# Patient Record
Sex: Female | Born: 1985 | Race: White | Hispanic: No | Marital: Single | Smoking: Never smoker
Health system: Southern US, Community
[De-identification: ages and names within clinical notes are randomized; demographics above are authoritative.]

## PROBLEM LIST (undated history)

## (undated) DIAGNOSIS — D649 Anemia, unspecified: Secondary | ICD-10-CM

## (undated) DIAGNOSIS — R51 Headache: Secondary | ICD-10-CM

## (undated) DIAGNOSIS — R519 Headache, unspecified: Secondary | ICD-10-CM

## (undated) HISTORY — DX: Anemia, unspecified: D64.9

## (undated) HISTORY — DX: Headache, unspecified: R51.9

## (undated) HISTORY — DX: Headache: R51

---

## 2004-08-31 ENCOUNTER — Ambulatory Visit: Payer: Self-pay | Admitting: Unknown Physician Specialty

## 2005-08-23 ENCOUNTER — Emergency Department: Payer: Self-pay | Admitting: Emergency Medicine

## 2008-05-19 ENCOUNTER — Inpatient Hospital Stay: Payer: Self-pay | Admitting: Obstetrics and Gynecology

## 2010-08-27 ENCOUNTER — Encounter: Payer: Self-pay | Admitting: Obstetrics and Gynecology

## 2011-02-26 ENCOUNTER — Ambulatory Visit: Payer: Self-pay | Admitting: Obstetrics and Gynecology

## 2011-03-01 ENCOUNTER — Inpatient Hospital Stay: Payer: Self-pay | Admitting: Obstetrics and Gynecology

## 2014-10-17 ENCOUNTER — Encounter: Payer: Self-pay | Admitting: Podiatry

## 2014-10-17 ENCOUNTER — Ambulatory Visit (INDEPENDENT_AMBULATORY_CARE_PROVIDER_SITE_OTHER): Payer: BC Managed Care – PPO

## 2014-10-17 ENCOUNTER — Ambulatory Visit (INDEPENDENT_AMBULATORY_CARE_PROVIDER_SITE_OTHER): Payer: BC Managed Care – PPO | Admitting: Podiatry

## 2014-10-17 VITALS — BP 123/76 | HR 58 | Resp 16 | Ht 62.0 in | Wt 145.0 lb

## 2014-10-17 DIAGNOSIS — M779 Enthesopathy, unspecified: Secondary | ICD-10-CM | POA: Diagnosis not present

## 2014-10-17 DIAGNOSIS — R52 Pain, unspecified: Secondary | ICD-10-CM

## 2014-10-17 DIAGNOSIS — M722 Plantar fascial fibromatosis: Secondary | ICD-10-CM

## 2014-10-17 MED ORDER — MELOXICAM 15 MG PO TABS: 15.0000 mg | ORAL_TABLET | Freq: Every day | ORAL | Status: DC

## 2014-10-17 MED ORDER — TRIAMCINOLONE ACETONIDE 10 MG/ML IJ SUSP
10.0000 mg | Freq: Once | INTRAMUSCULAR | Status: AC
  Administered 2014-10-17: 10 mg

## 2014-10-17 NOTE — Progress Notes (Signed)
Subjective:     Patient ID: Ellwood Sayersmanda D Matar, female   DOB: 12/29/1985, 29 y.o.   MRN: [id number]  HPI 29 year old female presents the office concerns her right foot pain which is been ongoing for greater than one month. She states that she has pain in the morning or after periods of rest which is relieved by ambulation. She states that as is his been ongoing she starts to have more throbbing sensation at the end of the day as well. She states the pain started the bottom of her heel and moved the outside aspect. She denies any history of injury or trauma to the area. She states that she did switch to wearing sandals at the time of the symptoms started. She denies any swelling or redness. Denies any numbness or tingling. The pain does not wake her up at night. No other complaints at this time.  Review of Systems  All other systems reviewed and are negative.       Objective:   Physical Exam AAO x3, NAD DP/PT pulses palpable bilaterally, CRT less than 3 seconds Protective sensation intact with Simms Weinstein monofilament, vibratory sensation intact, Achilles tendon reflex intact Tenderness to palpation overlying the plantar medial tubercle of the calcaneus to right heel at the insertion of the plantar fascia. There is no pain along the course of plantar fascial within the arch of the foot. There is no pain with lateral compression of the calcaneus or pain the vibratory sensation. No pain on the posterior aspect of the calcaneus or along the course/insertion of the Achilles tendon. There is mild discomfort along the course of the peroneal tendons just proximal to the fifth metatarsal base. Tendons appear to be intact. There is no overlying edema, erythema, increase in warmth. No other areas of tenderness palpation or pain with vibratory sensation to the foot/ankle. MMT 5/5, ROM WNL No open lesions or pre-ulcerative lesions are identified. No pain with calf compression, swelling, warmth, erythema.       Assessment:     29 year old female with right heel pain, likely plantar fascial compensation resulting in peroneal tendinitis    Plan:     -X-rays were obtained and reviewed with the patient.  -Treatment options discussed including all alternatives, risks, and complications -Patient elects to proceed with steroid injection into the right heel. Under sterile skin preparation, a total of 2.5cc of kenalog 10, 0.5% Marcaine plain, and 2% lidocaine plain were infiltrated into the symptomatic area without complication. A band-aid was applied. Patient tolerated the injection well without complication. Post-injection care with discussed with the patient. Discussed with the patient to ice the area over the next couple of days to help prevent a steroid flare -Dispensed plantar fascial brace  -Prescribed mobic. Discussed side effects of the medication and directed to stop if any are to occur and call the office.  -Discussed stretching and icing activities to be performed on a consistent basis daily.  -Discussed shoe gear modifications recommended not to go barefoot even while at home.  -Discussed orthotics.  -Follow-up 3 weeks or sooner if any problems arise. In the meantime, encouraged to call the office with any questions, concerns, change in symptoms.   Ovid CurdMatthew Wagoner, DPM

## 2014-10-17 NOTE — Patient Instructions (Signed)

## 2014-11-12 ENCOUNTER — Ambulatory Visit (INDEPENDENT_AMBULATORY_CARE_PROVIDER_SITE_OTHER): Payer: BC Managed Care – PPO | Admitting: Podiatry

## 2014-11-12 ENCOUNTER — Encounter: Payer: Self-pay | Admitting: Podiatry

## 2014-11-12 VITALS — BP 109/61 | HR 62 | Resp 16

## 2014-11-12 DIAGNOSIS — M722 Plantar fascial fibromatosis: Secondary | ICD-10-CM | POA: Diagnosis not present

## 2014-11-12 MED ORDER — METHYLPREDNISOLONE 4 MG PO TBPK
ORAL_TABLET | ORAL | Status: DC
Start: 2014-11-12 — End: 2015-07-31

## 2014-11-12 NOTE — Progress Notes (Signed)
Patient ID: Brittany Mosley, female   DOB: 20-Jan-1986, 29 y.o.   MRN: [id number]  Subjective: 29 year old female presents the office they for followup evaluation of right heel pain for plantar fasciitis as well as peroneal tendinitis. She states that she feels that her pain has improved compared to last appointment was to discontinue the pain mostly to the bottom of her heel. She has continued to wear the plantar fascial brace however she cannot wear was shoes uncomfortable. She was to brace with a sandal which seems to help with him in regular tennis shoe. She has a states that she can feel different she does not take the mobic. She does not think the steroid injection helped much.  She is continuing stretching icing it is not consistent basis. Denies any swelling or redness. Denies any numbness or tingling. No other complaints at this time. No acute changes since last appointment.  Objective: AAO x3, NAD DP/PT pulses palpable bilaterally, CRT less than 3 seconds Protective sensation intact with Simms Weinstein monofilament, vibratory sensation intact, Achilles tendon reflex intact There is continued tenderness to palpation overlying the plantar medial tubercle of the calcaneus to the right heel at the insertion of the plantar fascia. There is no pain along the course of plantar fascia within the arch of the foot. There is no pain with lateral compression of the calcaneus or pain the vibratory sensation. No pain on the posterior aspect of the calcaneus or along the course/insertion of the Achilles tendon. There is no pain along the course of the peroneal tendons.There is no overlying edema, erythema, increase in warmth. No other areas of tenderness palpation or pain with vibratory sensation to the foot/ankle. MMT 5/5, ROM WNL No open lesions or pre-ulcerative lesions are identified. No pain with calf compression, swelling, warmth, erythema.  Assessment: 29 -year-old female followup evaluation of right  plantar fasciitis  Plan: -Treatment options discussed including all alternatives, risks, and complications -Hold off another steroid injection a significant relief last appointment. -Prescribed Medrol Dosepak. Off on taking mobic while on steroids. -Continue stretching and ice activities a consistent basis. -Follow-up 4 weeks or sooner if any problems arise. In the meantime, encouraged to call the office with any questions, concerns, change in symptoms.  -Discussed shoe gear modifications. She'll likely benefit from orthotics is a 29 mechanical in nature.   Ovid Curd, DPM

## 2014-11-12 NOTE — Patient Instructions (Signed)
Plantar Fasciitis (Heel Spur Syndrome) with Rehab The plantar fascia is a fibrous, ligament-like, soft-tissue structure that spans the bottom of the foot. Plantar fasciitis is a condition that causes pain in the foot due to inflammation of the tissue. SYMPTOMS   Pain and tenderness on the underneath side of the foot.  Pain that worsens with standing or walking. CAUSES  Plantar fasciitis is caused by irritation and injury to the plantar fascia on the underneath side of the foot. Common mechanisms of injury include:  Direct trauma to bottom of the foot.  Damage to a small nerve that runs under the foot where the main fascia attaches to the heel bone.  Stress placed on the plantar fascia due to bone spurs. RISK INCREASES WITH:   Activities that place stress on the plantar fascia (running, jumping, pivoting, or cutting).  Poor strength and flexibility.  Improperly fitted shoes.  Tight calf muscles.  Flat feet.  Failure to warm-up properly before activity.  Obesity. PREVENTION  Warm up and stretch properly before activity.  Allow for adequate recovery between workouts.  Maintain physical fitness:  Strength, flexibility, and endurance.  Cardiovascular fitness.  Maintain a health body weight.  Avoid stress on the plantar fascia.  Wear properly fitted shoes, including arch supports for individuals who have flat feet. PROGNOSIS  If treated properly, then the symptoms of plantar fasciitis usually resolve without surgery. However, occasionally surgery is necessary. RELATED COMPLICATIONS   Recurrent symptoms that may result in a chronic condition.  Problems of the lower back that are caused by compensating for the injury, such as limping.  Pain or weakness of the foot during push-off following surgery.  Chronic inflammation, scarring, and partial or complete fascia tear, occurring more often from repeated injections. TREATMENT  Treatment initially involves the use of  ice and medication to help reduce pain and inflammation. The use of strengthening and stretching exercises may help reduce pain with activity, especially stretches of the Achilles tendon. These exercises may be performed at home or with a therapist. Your caregiver may recommend that you use heel cups of arch supports to help reduce stress on the plantar fascia. Occasionally, corticosteroid injections are given to reduce inflammation. If symptoms persist for greater than 6 months despite non-surgical (conservative), then surgery may be recommended.  MEDICATION   If pain medication is necessary, then nonsteroidal anti-inflammatory medications, such as aspirin and ibuprofen, or other minor pain relievers, such as acetaminophen, are often recommended.  Do not take pain medication within 7 days before surgery.  Prescription pain relievers may be given if deemed necessary by your caregiver. Use only as directed and only as much as you need.  Corticosteroid injections may be given by your caregiver. These injections should be reserved for the most serious cases, because they may only be given a certain number of times. HEAT AND COLD  Cold treatment (icing) relieves pain and reduces inflammation. Cold treatment should be applied for 10 to 15 minutes every 2 to 3 hours for inflammation and pain and immediately after any activity that aggravates your symptoms. Use ice packs or massage the area with a piece of ice (ice massage).  Heat treatment may be used prior to performing the stretching and strengthening activities prescribed by your caregiver, physical therapist, or athletic trainer. Use a heat pack or soak the injury in warm water. SEEK IMMEDIATE MEDICAL CARE IF:  Treatment seems to offer no benefit, or the condition worsens.  Any medications produce adverse side effects. EXERCISES RANGE   OF MOTION (ROM) AND STRETCHING EXERCISES - Plantar Fasciitis (Heel Spur Syndrome) These exercises may help you  when beginning to rehabilitate your injury. Your symptoms may resolve with or without further involvement from your physician, physical therapist or athletic trainer. While completing these exercises, remember:   Restoring tissue flexibility helps normal motion to return to the joints. This allows healthier, less painful movement and activity.  An effective stretch should be held for at least 30 seconds.  A stretch should never be painful. You should only feel a gentle lengthening or release in the stretched tissue. RANGE OF MOTION - Toe Extension, Flexion  Sit with your right / left leg crossed over your opposite knee.  Grasp your toes and gently pull them back toward the top of your foot. You should feel a stretch on the bottom of your toes and/or foot.  Hold this stretch for __________ seconds.  Now, gently pull your toes toward the bottom of your foot. You should feel a stretch on the top of your toes and or foot.  Hold this stretch for __________ seconds. Repeat __________ times. Complete this stretch __________ times per day.  RANGE OF MOTION - Ankle Dorsiflexion, Active Assisted  Remove shoes and sit on a chair that is preferably not on a carpeted surface.  Place right / left foot under knee. Extend your opposite leg for support.  Keeping your heel down, slide your right / left foot back toward the chair until you feel a stretch at your ankle or calf. If you do not feel a stretch, slide your bottom forward to the edge of the chair, while still keeping your heel down.  Hold this stretch for __________ seconds. Repeat __________ times. Complete this stretch __________ times per day.  STRETCH - Gastroc, Standing  Place hands on wall.  Extend right / left leg, keeping the front knee somewhat bent.  Slightly point your toes inward on your back foot.  Keeping your right / left heel on the floor and your knee straight, shift your weight toward the wall, not allowing your back to  arch.  You should feel a gentle stretch in the right / left calf. Hold this position for __________ seconds. Repeat __________ times. Complete this stretch __________ times per day. STRETCH - Soleus, Standing  Place hands on wall.  Extend right / left leg, keeping the other knee somewhat bent.  Slightly point your toes inward on your back foot.  Keep your right / left heel on the floor, bend your back knee, and slightly shift your weight over the back leg so that you feel a gentle stretch deep in your back calf.  Hold this position for __________ seconds. Repeat __________ times. Complete this stretch __________ times per day. STRETCH - Gastrocsoleus, Standing  Note: This exercise can place a lot of stress on your foot and ankle. Please complete this exercise only if specifically instructed by your caregiver.   Place the ball of your right / left foot on a step, keeping your other foot firmly on the same step.  Hold on to the wall or a rail for balance.  Slowly lift your other foot, allowing your body weight to press your heel down over the edge of the step.  You should feel a stretch in your right / left calf.  Hold this position for __________ seconds.  Repeat this exercise with a slight bend in your right / left knee. Repeat __________ times. Complete this stretch __________ times per day.    STRENGTHENING EXERCISES - Plantar Fasciitis (Heel Spur Syndrome)  These exercises may help you when beginning to rehabilitate your injury. They may resolve your symptoms with or without further involvement from your physician, physical therapist or athletic trainer. While completing these exercises, remember:   Muscles can gain both the endurance and the strength needed for everyday activities through controlled exercises.  Complete these exercises as instructed by your physician, physical therapist or athletic trainer. Progress the resistance and repetitions only as guided. STRENGTH -  Towel Curls  Sit in a chair positioned on a non-carpeted surface.  Place your foot on a towel, keeping your heel on the floor.  Pull the towel toward your heel by only curling your toes. Keep your heel on the floor.  If instructed by your physician, physical therapist or athletic trainer, add ____________________ at the end of the towel. Repeat __________ times. Complete this exercise __________ times per day. STRENGTH - Ankle Inversion  Secure one end of a rubber exercise band/tubing to a fixed object (table, pole). Loop the other end around your foot just before your toes.  Place your fists between your knees. This will focus your strengthening at your ankle.  Slowly, pull your big toe up and in, making sure the band/tubing is positioned to resist the entire motion.  Hold this position for __________ seconds.  Have your muscles resist the band/tubing as it slowly pulls your foot back to the starting position. Repeat __________ times. Complete this exercises __________ times per day.  Document Released: 04/05/2005 Document Revised: 06/28/2011 Document Reviewed: 07/18/2008 [employer] Patient Information 2015 [employer]. This information is not intended to replace advice given to you by your health care provider. Make sure you discuss any questions you have with your health care provider.  

## 2014-12-10 ENCOUNTER — Encounter: Payer: Self-pay | Admitting: Podiatry

## 2014-12-10 ENCOUNTER — Ambulatory Visit (INDEPENDENT_AMBULATORY_CARE_PROVIDER_SITE_OTHER): Payer: BC Managed Care – PPO | Admitting: Podiatry

## 2014-12-10 VITALS — BP 125/71 | HR 65 | Resp 16

## 2014-12-10 DIAGNOSIS — M722 Plantar fascial fibromatosis: Secondary | ICD-10-CM

## 2014-12-10 MED ORDER — TRIAMCINOLONE ACETONIDE 10 MG/ML IJ SUSP
10.0000 mg | Freq: Once | INTRAMUSCULAR | Status: AC
  Administered 2014-12-10: 10 mg

## 2014-12-10 NOTE — Patient Instructions (Signed)
Plantar Fasciitis (Heel Spur Syndrome) with Rehab The plantar fascia is a fibrous, ligament-like, soft-tissue structure that spans the bottom of the foot. Plantar fasciitis is a condition that causes pain in the foot due to inflammation of the tissue. SYMPTOMS   Pain and tenderness on the underneath side of the foot.  Pain that worsens with standing or walking. CAUSES  Plantar fasciitis is caused by irritation and injury to the plantar fascia on the underneath side of the foot. Common mechanisms of injury include:  Direct trauma to bottom of the foot.  Damage to a small nerve that runs under the foot where the main fascia attaches to the heel bone.  Stress placed on the plantar fascia due to bone spurs. RISK INCREASES WITH:   Activities that place stress on the plantar fascia (running, jumping, pivoting, or cutting).  Poor strength and flexibility.  Improperly fitted shoes.  Tight calf muscles.  Flat feet.  Failure to warm-up properly before activity.  Obesity. PREVENTION  Warm up and stretch properly before activity.  Allow for adequate recovery between workouts.  Maintain physical fitness:  Strength, flexibility, and endurance.  Cardiovascular fitness.  Maintain a health body weight.  Avoid stress on the plantar fascia.  Wear properly fitted shoes, including arch supports for individuals who have flat feet. PROGNOSIS  If treated properly, then the symptoms of plantar fasciitis usually resolve without surgery. However, occasionally surgery is necessary. RELATED COMPLICATIONS   Recurrent symptoms that may result in a chronic condition.  Problems of the lower back that are caused by compensating for the injury, such as limping.  Pain or weakness of the foot during push-off following surgery.  Chronic inflammation, scarring, and partial or complete fascia tear, occurring more often from repeated injections. TREATMENT  Treatment initially involves the use of  ice and medication to help reduce pain and inflammation. The use of strengthening and stretching exercises may help reduce pain with activity, especially stretches of the Achilles tendon. These exercises may be performed at home or with a therapist. Your caregiver may recommend that you use heel cups of arch supports to help reduce stress on the plantar fascia. Occasionally, corticosteroid injections are given to reduce inflammation. If symptoms persist for greater than 6 months despite non-surgical (conservative), then surgery may be recommended.  MEDICATION   If pain medication is necessary, then nonsteroidal anti-inflammatory medications, such as aspirin and ibuprofen, or other minor pain relievers, such as acetaminophen, are often recommended.  Do not take pain medication within 7 days before surgery.  Prescription pain relievers may be given if deemed necessary by your caregiver. Use only as directed and only as much as you need.  Corticosteroid injections may be given by your caregiver. These injections should be reserved for the most serious cases, because they may only be given a certain number of times. HEAT AND COLD  Cold treatment (icing) relieves pain and reduces inflammation. Cold treatment should be applied for 10 to 15 minutes every 2 to 3 hours for inflammation and pain and immediately after any activity that aggravates your symptoms. Use ice packs or massage the area with a piece of ice (ice massage).  Heat treatment may be used prior to performing the stretching and strengthening activities prescribed by your caregiver, physical therapist, or athletic trainer. Use a heat pack or soak the injury in warm water. SEEK IMMEDIATE MEDICAL CARE IF:  Treatment seems to offer no benefit, or the condition worsens.  Any medications produce adverse side effects. EXERCISES RANGE   OF MOTION (ROM) AND STRETCHING EXERCISES - Plantar Fasciitis (Heel Spur Syndrome) These exercises may help you  when beginning to rehabilitate your injury. Your symptoms may resolve with or without further involvement from your physician, physical therapist or athletic trainer. While completing these exercises, remember:   Restoring tissue flexibility helps normal motion to return to the joints. This allows healthier, less painful movement and activity.  An effective stretch should be held for at least 30 seconds.  A stretch should never be painful. You should only feel a gentle lengthening or release in the stretched tissue. RANGE OF MOTION - Toe Extension, Flexion  Sit with your right / left leg crossed over your opposite knee.  Grasp your toes and gently pull them back toward the top of your foot. You should feel a stretch on the bottom of your toes and/or foot.  Hold this stretch for __________ seconds.  Now, gently pull your toes toward the bottom of your foot. You should feel a stretch on the top of your toes and or foot.  Hold this stretch for __________ seconds. Repeat __________ times. Complete this stretch __________ times per day.  RANGE OF MOTION - Ankle Dorsiflexion, Active Assisted  Remove shoes and sit on a chair that is preferably not on a carpeted surface.  Place right / left foot under knee. Extend your opposite leg for support.  Keeping your heel down, slide your right / left foot back toward the chair until you feel a stretch at your ankle or calf. If you do not feel a stretch, slide your bottom forward to the edge of the chair, while still keeping your heel down.  Hold this stretch for __________ seconds. Repeat __________ times. Complete this stretch __________ times per day.  STRETCH - Gastroc, Standing  Place hands on wall.  Extend right / left leg, keeping the front knee somewhat bent.  Slightly point your toes inward on your back foot.  Keeping your right / left heel on the floor and your knee straight, shift your weight toward the wall, not allowing your back to  arch.  You should feel a gentle stretch in the right / left calf. Hold this position for __________ seconds. Repeat __________ times. Complete this stretch __________ times per day. STRETCH - Soleus, Standing  Place hands on wall.  Extend right / left leg, keeping the other knee somewhat bent.  Slightly point your toes inward on your back foot.  Keep your right / left heel on the floor, bend your back knee, and slightly shift your weight over the back leg so that you feel a gentle stretch deep in your back calf.  Hold this position for __________ seconds. Repeat __________ times. Complete this stretch __________ times per day. STRETCH - Gastrocsoleus, Standing  Note: This exercise can place a lot of stress on your foot and ankle. Please complete this exercise only if specifically instructed by your caregiver.   Place the ball of your right / left foot on a step, keeping your other foot firmly on the same step.  Hold on to the wall or a rail for balance.  Slowly lift your other foot, allowing your body weight to press your heel down over the edge of the step.  You should feel a stretch in your right / left calf.  Hold this position for __________ seconds.  Repeat this exercise with a slight bend in your right / left knee. Repeat __________ times. Complete this stretch __________ times per day.    STRENGTHENING EXERCISES - Plantar Fasciitis (Heel Spur Syndrome)  These exercises may help you when beginning to rehabilitate your injury. They may resolve your symptoms with or without further involvement from your physician, physical therapist or athletic trainer. While completing these exercises, remember:   Muscles can gain both the endurance and the strength needed for everyday activities through controlled exercises.  Complete these exercises as instructed by your physician, physical therapist or athletic trainer. Progress the resistance and repetitions only as guided. STRENGTH -  Towel Curls  Sit in a chair positioned on a non-carpeted surface.  Place your foot on a towel, keeping your heel on the floor.  Pull the towel toward your heel by only curling your toes. Keep your heel on the floor.  If instructed by your physician, physical therapist or athletic trainer, add ____________________ at the end of the towel. Repeat __________ times. Complete this exercise __________ times per day. STRENGTH - Ankle Inversion  Secure one end of a rubber exercise band/tubing to a fixed object (table, pole). Loop the other end around your foot just before your toes.  Place your fists between your knees. This will focus your strengthening at your ankle.  Slowly, pull your big toe up and in, making sure the band/tubing is positioned to resist the entire motion.  Hold this position for __________ seconds.  Have your muscles resist the band/tubing as it slowly pulls your foot back to the starting position. Repeat __________ times. Complete this exercises __________ times per day.  Document Released: 04/05/2005 Document Revised: 06/28/2011 Document Reviewed: 07/18/2008 [employer] Patient Information 2015 [employer]. This information is not intended to replace advice given to you by your health care provider. Make sure you discuss any questions you have with your health care provider.  

## 2014-12-11 NOTE — Progress Notes (Signed)
Patient ID: REIZY DUNLOW, female   DOB: 1986/01/03, 29 y.o.   MRN: [id number]  Subjective: 29 year old female presents the office today for follow valuation of right heel pain, plantar fasciitis. She states that she continues to have some discomfort over the bottom of her heel on the right side was in the morning or after standing all day at work. She denies any numbness or tingling. Denies any swelling or redness. No recent injury or trauma. No other complaints at this time. No acute changes his last appointment.  Objective: AAO x3, NAD DP/PT pulses palpable bilaterally, CRT less than 3 seconds Protective sensation intact with Simms Weinstein monofilament, negative tinel sign There is continued tenderness to palpation overlying the plantar medial tubercle of the calcaneus to the right heel at the insertion of the plantar fascia. There is no pain along the course of plantar fascial within the arch of the foot. There is no pain with lateral compression of the calcaneus or pain the vibratory sensation. No pain on the posterior aspect of the calcaneus or along the course/insertion of the Achilles tendon. There is no overlying edema, erythema, increase in warmth. No other areas of tenderness palpation or pain with vibratory sensation to the foot/ankle. No open lesions or pre-ulcerative lesions are identified. No pain with calf compression, swelling, warmth, erythema.  Assessment:  29 year old female with continued right heel pain, plantar fasciitis.  Plan: -Treatment options discussed including all alternatives, risks, and complications Patient elects to proceed with steroid injection into the right heel. Under sterile skin preparation, a total of 2.5cc of kenalog 10, 0.5% Marcaine plain, and 2% lidocaine plain were infiltrated into the symptomatic area without complication. A band-aid was applied. Patient tolerated the injection well without complication. Post-injection care with discussed with the  patient. Discussed with the patient to ice the area over the next couple of days to help prevent a steroid flare.  -Plantar fascial taking applied. -Continue meloxicam and treated. Recommended not to take him daily basis. -Continue stretching and icing activities daily. -Shoe gear modifications. Discussed orthotics. -Follow-up 3 weeks or sooner if any problems arise. In the meantime, encouraged to call the office with any questions, concerns, change in symptoms.  * We are to check an orthotic benefits before next appointment.   Ovid Curd, DPM

## 2015-01-09 ENCOUNTER — Encounter: Payer: Self-pay | Admitting: Podiatry

## 2015-01-09 ENCOUNTER — Ambulatory Visit (INDEPENDENT_AMBULATORY_CARE_PROVIDER_SITE_OTHER): Payer: BC Managed Care – PPO | Admitting: Podiatry

## 2015-01-09 VITALS — BP 107/79 | HR 76 | Resp 16

## 2015-01-09 DIAGNOSIS — M722 Plantar fascial fibromatosis: Secondary | ICD-10-CM

## 2015-01-10 NOTE — Progress Notes (Signed)
Patient ID: Brittany Mosley, female   DOB: [birth year], 29 y.o.   MRN: [id number]  Subjective: 29 year old female presents the office today for follow-up evaluation of right heel pain, plantar fasciitis. She states that since last appointment she had complete resolution of symptoms. Proximal one week ago she did go to the gym and increase her activity and she was using elliptical. The next day she had recurrence of the heel pain. She states that she stretches intermittently but does not ice. She does not stretch before or after working out. She has changed her shoes and states this helps tremendously. Since then she has had some resolution of pain although she has continued has some pain. She denies any recent injury or trauma. No tingling or numbness. No swelling or redness. The pain does not wake her up at night. No other complaints at this time.  Objective: AAO x3, NAD DP/PT pulses palpable bilaterally, CRT less than 3 seconds Protective sensation intact with Simms Weinstein monofilament, vibratory sensation intact, Achilles tendon reflex intact There is mild tenderness to palpation overlying the plantar medial tubercle of the calcaneus to the right heel at the insertion of the plantar fascia. There is no pain along the course of plantar fascia within the arch of the foot and the plantar fascia appears intact. There is no pain with lateral compression of the calcaneus or pain the vibratory sensation. No pain on the posterior aspect of the calcaneus or along the course/insertion of the Achilles tendon. There is no overlying edema, erythema, increase in warmth. No other areas of tenderness palpation or pain with vibratory sensation to the foot/ankle.  No open lesions or pre-ulcerative lesions are identified. No pain with calf compression, swelling, warmth, erythema.  Assessment: 29 year old female recurrence of right heel pain, plantar fasciitis.  Plan: -Treatment options discussed including all  alternatives, risks, and complications -Etiology of symptoms were discussed -Patient elects to proceed with steroid injection into the right heel. Under sterile skin preparation, a total of 2.5cc of kenalog 10, 0.5% Marcaine plain, and 2% lidocaine plain were infiltrated into the symptomatic area without complication. A band-aid was applied. Patient tolerated the injection well without complication. Post-injection care with discussed with the patient. Discussed with the patient to ice the area over the next couple of days to help prevent a steroid flare.  -Refilled mobic. -Stretching and icing activities daily. -Continue supportive shoe gear. -Discussed orthotics. -Follow-up in 4 weeks or sooner if any problems arise. In the meantime, encouraged to call the office with any questions, concerns, change in symptoms.   Ovid Curd, DPM

## 2015-02-06 ENCOUNTER — Encounter: Payer: Self-pay | Admitting: Podiatry

## 2015-02-06 ENCOUNTER — Ambulatory Visit (INDEPENDENT_AMBULATORY_CARE_PROVIDER_SITE_OTHER): Payer: BC Managed Care – PPO | Admitting: Podiatry

## 2015-02-06 VITALS — BP 105/68 | HR 70 | Resp 18

## 2015-02-06 DIAGNOSIS — M722 Plantar fascial fibromatosis: Secondary | ICD-10-CM | POA: Diagnosis not present

## 2015-02-06 MED ORDER — DICLOFENAC SODIUM 1 % TD GEL: 2.0000 g | Freq: Four times a day (QID) | TRANSDERMAL | Status: DC

## 2015-02-08 NOTE — Progress Notes (Signed)
Patient ID: Brittany Mosley, female   DOB: 03/29/1986, 29 y.o.   MRN: [id number]  Subjective: 29 year old female presents the office today for follow-up evaluation of right heel pain, plantar fasciitis. She states that she believes that her pain is resolving. She does continue to have pain to her heel at times but not a significant was what it was previously. She states that she no longer has pain first of the morning. She is continue icing and stretching exercises regularly however not daily basis. She does continue to wear supportive shoe at all times. She has a no complications of the steroid injections. She denies any numbness or tingling. The pain does not wake her up at night. No other complaints at this time in no acute changes.  Objective: AAO x3, NAD DP/PT pulses palpable bilaterally, CRT less than 3 seconds Protective sensation intact with Simms Weinstein monofilament, vibratory sensation intact, Achilles tendon reflex intact There is mild tenderness to palpation overlying the plantar medial tubercle of the calcaneus to the right heel at the insertion of the plantar fascia, although it appears to be resolving. There is no pain along the course of plantar fascia within the arch of the foot and the plantar fascia appears intact. There is no pain with lateral compression of the calcaneus or pain the vibratory sensation. No pain on the posterior aspect of the calcaneus or along the course/insertion of the Achilles tendon. There is no overlying edema, erythema, increase in warmth. No other areas of tenderness palpation or pain with vibratory sensation to the foot/ankle.  No open lesions or pre-ulcerative lesions are identified. No pain with calf compression, swelling, warmth, erythema.  Assessment: [age] year old female recurrence of right heel pain, plantar fasciitis; resolving  Plan: -Treatment options discussed including all alternatives, risks, and complications -I discussed further steroid  injection however she wishes to hold off on that this time. I discussed other treatment options including physical therapy, EPAT, orthotics (she states she would not wear them due to shoe limitations). She feels that she is getting better and she were to hold off on further treatment and went the pain right is course. I discussed that if it still bothersome within 4 weeks' to call the office for follow-up appointment. Prescribed Voltaren gel. Meantime call the office with any questions, concerns, change in symptoms.  Ovid CurdMatthew Andrell Bergeson, DPM

## 2015-05-27 LAB — HM PAP SMEAR

## 2015-07-31 ENCOUNTER — Ambulatory Visit
Admission: RE | Admit: 2015-07-31 | Discharge: 2015-07-31 | Disposition: A | Discharge status: Home / Self Care | Payer: BC Managed Care – PPO | Source: Ambulatory Visit | Attending: Family Medicine | Admitting: Family Medicine

## 2015-07-31 ENCOUNTER — Ambulatory Visit (INDEPENDENT_AMBULATORY_CARE_PROVIDER_SITE_OTHER): Payer: BC Managed Care – PPO | Admitting: Family Medicine

## 2015-07-31 ENCOUNTER — Encounter: Payer: Self-pay | Admitting: Family Medicine

## 2015-07-31 VITALS — BP 114/61 | HR 75 | Temp 98.8°F | Resp 16 | Ht 62.0 in | Wt 149.8 lb

## 2015-07-31 DIAGNOSIS — R05 Cough: Secondary | ICD-10-CM | POA: Diagnosis not present

## 2015-07-31 DIAGNOSIS — F32A Depression, unspecified: Secondary | ICD-10-CM

## 2015-07-31 DIAGNOSIS — R058 Other specified cough: Secondary | ICD-10-CM

## 2015-07-31 DIAGNOSIS — F329 Major depressive disorder, single episode, unspecified: Secondary | ICD-10-CM

## 2015-07-31 DIAGNOSIS — F4321 Adjustment disorder with depressed mood: Secondary | ICD-10-CM | POA: Insufficient documentation

## 2015-07-31 DIAGNOSIS — R059 Cough, unspecified: Secondary | ICD-10-CM

## 2015-07-31 MED ORDER — BENZONATATE 100 MG PO CAPS: 100.0000 mg | ORAL_CAPSULE | Freq: Three times a day (TID) | ORAL | Status: DC | PRN

## 2015-07-31 MED ORDER — FLUTICASONE PROPIONATE 50 MCG/ACT NA SUSP: 2.0000 | Freq: Every day | NASAL | Status: AC

## 2015-07-31 MED ORDER — PREDNISONE 20 MG PO TABS: 40.0000 mg | ORAL_TABLET | Freq: Every day | ORAL | Status: DC

## 2015-07-31 MED ORDER — LORATADINE 10 MG PO TABS: 10.0000 mg | ORAL_TABLET | Freq: Every day | ORAL | Status: AC

## 2015-07-31 MED ORDER — ALBUTEROL SULFATE HFA 108 (90 BASE) MCG/ACT IN AERS: 2.0000 | INHALATION_SPRAY | Freq: Four times a day (QID) | RESPIRATORY_TRACT | Status: DC | PRN

## 2015-07-31 NOTE — Assessment & Plan Note (Signed)
On Zoloft doing well. Full depression follow-up deferred for non-sick visit.

## 2015-07-31 NOTE — Patient Instructions (Signed)
We will get an X-ray to rule out pneumonia. I think we can treat your cough with some steroids- to help reduce inflammation in the lungs. You can also use Tessalon Perles to help with your cough.   For the nasal drainage in the AM- dry OTC antihistamine such as Claritin and a flonase nasal spray. This will help dry up the secretions.   Please seek immediate medical attention if you develop shortness of breath not relieve by inhaler, chest pain/tightness, fever > 103 F or other concerning symptoms.

## 2015-07-31 NOTE — Assessment & Plan Note (Signed)
Post viral cough vs. Upper airway cough. CXR r/o pneumonia. Prednisone to help with cough. PRN tessalon perles. Flonase and claritin to help with nasal drainage. Follow-up 2-4 weeks. Consider pulmonology if symptoms do not improve.

## 2015-07-31 NOTE — Progress Notes (Signed)
Subjective:    Patient ID: Brittany Mosley, female    DOB: 05/18/1985, 30 y.o.   MRN: [id number]  HPI: Brittany Sayersmanda D Klingerman is a 30 y.o. female presenting on 07/31/2015 for Cough   HPI  Pt presents as new patient for sick visit. Previous provider was Dr. Arlana Pouchate. Records will be requested and reviewed.  Pt presents for cough x 4 weeks. Positive flu on 3/17- treated with Tamiflu. Cough returned- seen by previous PCP- treated with Zpak and tussionex- felt better- restarted on Monday. Cough is sometimes productive of clear sputum Some shortness of breath at times. Has some nasal drainage- clear. Cough is worse in the morning when she wakes up.   No history of astham  Past Medical History  Diagnosis Date  . Anemia   . Headache    Social History   Social History  . Marital Status: Single    Spouse Name: N/A  . Number of Children: N/A  . Years of Education: N/A   Occupational History  . Not on file.   Social History Main Topics  . Smoking status: Never Smoker   . Smokeless tobacco: Never Used  . Alcohol Use: Not on file  . Drug Use: Not on file  . Sexual Activity: Not on file   Other Topics Concern  . Not on file   Social History Narrative   Family History  Problem Relation Age of Onset  . Cancer Father   . Colon polyps Father   . Alcohol abuse Father   . Depression Father   . Mental illness Father   . Bipolar disorder Father    No current outpatient prescriptions on file prior to visit.   No current facility-administered medications on file prior to visit.    Review of Systems  Constitutional: Negative for fever and chills.  HENT: Positive for congestion and rhinorrhea. Negative for ear pain, sneezing and sore throat.   Eyes: Negative for redness and visual disturbance.  Respiratory: Positive for cough and shortness of breath. Negative for chest tightness and wheezing.   Cardiovascular: Negative for chest pain and palpitations.  Gastrointestinal: Negative.  Negative  for nausea, vomiting and diarrhea.  Musculoskeletal: Negative for neck pain and neck stiffness.  Allergic/Immunologic: Positive for environmental allergies.  Neurological: Negative for dizziness and headaches.  Psychiatric/Behavioral: Negative.    Per HPI unless specifically indicated above     Objective:    BP 114/61 mmHg  Pulse 75  Temp(Src) 98.8 F (37.1 C) (Oral)  Resp 16  Ht 5\' 2"  (1.575 m)  Wt 149 lb 12.8 oz (67.949 kg)  BMI 27.39 kg/m2  SpO2 100%  LMP 07/20/2015 (Exact Date)  Wt Readings from Last 3 Encounters:  07/31/15 149 lb 12.8 oz (67.949 kg)  10/17/14 145 lb (65.772 kg)    Physical Exam  Constitutional: She is oriented to person, place, and time. She appears well-developed and well-nourished.  HENT:  Head: Normocephalic and atraumatic.  Right Ear: Hearing and tympanic membrane normal.  Left Ear: Hearing and tympanic membrane normal.  Nose: Mucosal edema and rhinorrhea present. Right sinus exhibits no maxillary sinus tenderness and no frontal sinus tenderness. Left sinus exhibits no maxillary sinus tenderness and no frontal sinus tenderness.  Mouth/Throat: Oropharynx is clear and moist and mucous membranes are normal.  Neck: Neck supple.  Cardiovascular: Normal rate, regular rhythm and normal heart sounds.  Exam reveals no gallop and no friction rub.   No murmur heard. Pulmonary/Chest: Effort normal and breath sounds normal. She  has no decreased breath sounds. She has no wheezes. She has no rhonchi. She has no rales. Chest wall is not dull to percussion. She exhibits no tenderness.  Abdominal: Soft. Normal appearance and bowel sounds are normal. She exhibits no distension. There is no tenderness.  Musculoskeletal: Normal range of motion. She exhibits no edema or tenderness.  Lymphadenopathy:    She has no cervical adenopathy.  Neurological: She is alert and oriented to person, place, and time.  Skin: Skin is warm and dry.   No results found for this or any  previous visit.    Assessment & Plan:   Problem List Items Addressed This Visit      Other   Post-viral cough syndrome - Primary    Post viral cough vs. Upper airway cough. CXR r/o pneumonia. Prednisone to help with cough. PRN tessalon perles. Flonase and claritin to help with nasal drainage. Follow-up 2-4 weeks. Consider pulmonology if symptoms do not improve.       Relevant Medications   predniSONE (DELTASONE) 20 MG tablet   albuterol (PROVENTIL HFA;VENTOLIN HFA) 108 (90 Base) MCG/ACT inhaler   benzonatate (TESSALON) 100 MG capsule   fluticasone (FLONASE) 50 MCG/ACT nasal spray   loratadine (CLARITIN) 10 MG tablet   Other Relevant Orders   DG Chest 2 View (Completed)   Depression    On Zoloft doing well. Full depression follow-up deferred for non-sick visit.       Relevant Medications   sertraline (ZOLOFT) 50 MG tablet      Meds ordered this encounter  Medications  . sertraline (ZOLOFT) 50 MG tablet    Sig: Take 50 mg by mouth daily.  Marland Kitchen etonogestrel-ethinyl estradiol (NUVARING) 0.12-0.015 MG/24HR vaginal ring    Sig: Place 1 each vaginally every 28 (twenty-eight) days. Reported on 07/31/2015  . predniSONE (DELTASONE) 20 MG tablet    Sig: Take 2 tablets (40 mg total) by mouth daily with breakfast. For 5 days.    Dispense:  10 tablet    Refill:  0    Order Specific Question:  Supervising Provider    Answer:  Janeann Forehand [phone]  . albuterol (PROVENTIL HFA;VENTOLIN HFA) 108 (90 Base) MCG/ACT inhaler    Sig: Inhale 2 puffs into the lungs every 6 (six) hours as needed for wheezing or shortness of breath.    Dispense:  1 Inhaler    Refill:  11    Order Specific Question:  Supervising Provider    Answer:  Janeann Forehand [phone]  . benzonatate (TESSALON) 100 MG capsule    Sig: Take 1 capsule (100 mg total) by mouth 3 (three) times daily as needed.    Dispense:  30 capsule    Refill:  1    Order Specific Question:  Supervising Provider    Answer:  Janeann Forehand [phone]  . fluticasone (FLONASE) 50 MCG/ACT nasal spray    Sig: Place 2 sprays into both nostrils daily.    Dispense:  16 g    Refill:  11    Order Specific Question:  Supervising Provider    Answer:  Janeann Forehand [phone]  . loratadine (CLARITIN) 10 MG tablet    Sig: Take 1 tablet (10 mg total) by mouth daily.    Dispense:  30 tablet    Refill:  11    Order Specific Question:  Supervising Provider    Answer:  Janeann Forehand [phone]      Follow up plan:  Return if symptoms worsen or fail to improve.

## 2016-03-30 ENCOUNTER — Ambulatory Visit (INDEPENDENT_AMBULATORY_CARE_PROVIDER_SITE_OTHER): Payer: BC Managed Care – PPO | Admitting: Family Medicine

## 2016-03-30 ENCOUNTER — Encounter: Payer: Self-pay | Admitting: Family Medicine

## 2016-03-30 VITALS — BP 99/79 | HR 80 | Temp 98.2°F | Resp 16 | Ht 62.0 in | Wt 156.0 lb

## 2016-03-30 DIAGNOSIS — G43701 Chronic migraine without aura, not intractable, with status migrainosus: Secondary | ICD-10-CM | POA: Insufficient documentation

## 2016-03-30 DIAGNOSIS — G43711 Chronic migraine without aura, intractable, with status migrainosus: Secondary | ICD-10-CM

## 2016-03-30 DIAGNOSIS — F4321 Adjustment disorder with depressed mood: Secondary | ICD-10-CM | POA: Diagnosis not present

## 2016-03-30 MED ORDER — SUMATRIPTAN SUCCINATE 50 MG PO TABS: 50.0000 mg | ORAL_TABLET | Freq: Once | ORAL | 0 refills | Status: DC | PRN

## 2016-03-30 NOTE — Patient Instructions (Signed)
Thank you for coming in to clinic today.  1. You most likely have Chronic Migraine Headaches - Start with Sumatriptan 50mg  - take 1 immediately at onset of moderate to severe migraine headache, if unresolved or return within 2 hours then repeat dose 1 tablet, that is max dose for 24 hours. - Try this for 1-2 weeks, if absolutely NOT helping then contact me to discuss changes, possibly can double sumatriptan dose or try nasal spray - Keep detailed headache journal for possible triggers - Try to slowly reduce other OTC meds, you may continue Ibuprofen 800 and Excedrin for a couple doses until first 1-2 headaches resolve, IF NOT IMPROVING ON Sumatriptan, stop Tylenol. Eventually try to stop these medications as well. As you can get rebound headache if taking too much of these - Try to limit caffeine as well  Follow-up in future for Flu Shot and Annual Physical.  Look into some of these alternatives for Migraine Headache Prophylaxis or preventative treatment  Antidepressant Type - Venlafaxine, Amitriptyline Blood pressure meds - Metoprolol, Propanolol, Verapamil Anti Headache/Seizure/Nerve medications - Topamax, Gabapentin  Please schedule a follow-up appointment with Dr. Althea CharonKaramalegos in 4 weeks for Migraine follow-up  If you have any other questions or concerns, please feel free to call the clinic or send a message through MyChart. You may also schedule an earlier appointment if necessary.  Brittany PilarAlexander Carmeron Heady, Brittany Mosley [facility]

## 2016-03-30 NOTE — Progress Notes (Signed)
Subjective:    Patient ID: Brittany Mosley, female    DOB: 07/21/1985, 30 y.o.   MRN: [id number]  Brittany Mosley is a 30 y.o. female presenting on 03/30/2016 for Headache   HPI   Chronic Headaches: - Chronic problem for years, but now presenting to doctor for first time for headaches. Never diagnosed with migraines or any other headache condition. - Now concern with difficulty recovering from headaches, she feels like she can't function as well as she used to with headaches - Currently with active headache since Sunday, described as severe headache behind Right eye usually severe sharp stabbing pains now only mild-moderate 5/10 but up to 10/10 at worst sometimes throbbing, associated with some difficulty focusing especially with her vision (no loss of vision), only relief with time, does not seem to resolve with sleep (only temporary relief), worse with very high pitched noises or bright light, some loss of appetite and nausea (without vomiting), with some transition from R to Left sided headache with some easing pain today, usually has persistent headache lasting up to 2-3 weeks at a time, then can go weeks without headache. She did have 1 year without any headaches. - Tried Tylenol (up to 1000mg  per dose), Ibuprofen (400-800mg  q 4-6 hours), Excedrin without significant relief usually will get mild relief but never actually resolve headache, only mild-moderate improvement on more than 1 and alternating these medications, feels like she takes too many medications - No known family history of migraines - History of imaging: No head imaging - Known triggers: possibly related to any amount of alcohol, weather pressure changes - Admits some sinus drainage recently but now resolved, seems unrelated - Denies fevers/chills, sweats, nausea, vomiting, abdominal pain, dyspnea  H/o Adjustment Disorder with Depressed Mood - Resolved - Today reports she is asymptomatic, had this problem in past back in  07/2015 with brother in ICU, due to acute stressor she had some adjustment depressive symptoms, temporary, treated with Zoloft for 3 months, but it made her feel "detached" and not herself, overall she did well and recovered without any residual problems of mood or anxiety  Social History  Substance Use Topics  . Smoking status: Never Smoker  . Smokeless tobacco: Never Used  . Alcohol use No    Review of Systems Per HPI unless specifically indicated above     Objective:    BP 99/79   Pulse 80   Temp 98.2 F (36.8 C) (Oral)   Resp 16   Ht 5\' 2"  (1.575 m)   Wt 156 lb (70.8 kg)   BMI 28.53 kg/m   Wt Readings from Last 3 Encounters:  03/30/16 156 lb (70.8 kg)  07/31/15 149 lb 12.8 oz (67.9 kg)  10/17/14 145 lb (65.8 kg)     Physical Exam  Constitutional: She appears well-developed and well-nourished. No distress.  Well-appearing, comfortable, cooperative  HENT:  Head: Normocephalic and atraumatic.  Mouth/Throat: Oropharynx is clear and moist.  Frontal / maxillary sinuses non-tender. Nares patent without purulence or edema. Oropharynx clear without erythema, exudates, edema or asymmetry.  Eyes: Conjunctivae and EOM are normal. Pupils are equal, round, and reactive to light. Right eye exhibits no discharge. Left eye exhibits no discharge.  Neck: Normal range of motion. Neck supple. No thyromegaly present.  Cardiovascular: Normal rate, regular rhythm, normal heart sounds and intact distal pulses.   No murmur heard. Pulmonary/Chest: Effort normal and breath sounds normal. No respiratory distress. She has no wheezes. She has no rales.  Musculoskeletal:  Normal range of motion. She exhibits no edema.  Distal muscle strength 5/5 grip and ankles  Lymphadenopathy:    She has no cervical adenopathy.  Neurological: She is alert. No cranial nerve deficit. Coordination normal.  Distal muscle sensation intact to light touch  Skin: Skin is warm and dry. She is not diaphoretic.    Psychiatric: She has a normal mood and affect. Her behavior is normal. Thought content normal.  Nursing note and vitals reviewed.  No results found for this or any previous visit.    Assessment & Plan:   Problem List Items Addressed This Visit    Chronic migraine without aura, with status migrainosus - Primary    New diagnosis migraine today, Consistent with persistent intractable unilateral R migraine HA x 2 days (prior for days to weeks), chronically over past 2-3+ years - Suspected triggers with weather pressure changes / alcohol - Current HA is mild to improving, well-appearing, no focal neuro deficits, tolerating PO w/o n/v - Seems to have tried adequate OTC analgesia without relief at home (ibuprofen 800, Tylenol 1000, Excedrin Migraine, alternating meds). Never on rx abortive or preventative therapy  Plan: 1. Start abortive therapy with Sumatriptan 50mg  tabs - take 1 PRN (#20, 0 refill), severe HA, may repeat dose within 2 hr if persistent or recurrent, max dose 2 tabs in 24 hours. Within 1-2 weeks patient can notify office if not improved and will instruct to titrate up to 100mg  per dose if no relief at 50mg . Counseled on side effects with chest discomfort. 2. Recommend may still try Ibuprofen 600-800mg  q 8 hr PRN (aftet Sumatriptan) and slowly taper off ibuprofen if not helping, can alternate Excedrin Migraine as well, cautioned against rebound HA. Stop Tylenol. 3. Handout given for recording detailed HA diary for possible triggers, try to avoid known trigger alcohol, and unable to control weather pressure changes 4. Return criteria given if worsening, follow-up 4 weeks review progress, may need Sumatriptan nasal spray or Onzetra or other formulation. Also discuss migraine prophylaxis at next visit - unsure exact choice best for patient (intolerance to zoloft in past, BP is low normal pulse 80s)      Relevant Medications   SUMAtriptan (IMITREX) 50 MG tablet   RESOLVED: Adjustment  disorder with depressed mood    Resolved currently asymptomatic, after prior (07/2015) 3 month trial of Zoloft with dx adjustment disorder (acute stressor, brother in ICU), patient with depressive symptoms at that time.         Meds ordered this encounter  Medications  . SUMAtriptan (IMITREX) 50 MG tablet    Sig: Take 1 tablet (50 mg total) by mouth once as needed for migraine. May repeat in 2 hours if unresolved/returns, max dose 2 tabs in 24 hours.    Dispense:  20 tablet    Refill:  0    Follow up plan: Return in about 4 weeks (around 04/27/2016) for Migraine HA.  [name] Glema Takaki, DO [facility] 03/30/2016, 10:17 AM

## 2016-03-30 NOTE — Assessment & Plan Note (Addendum)
New diagnosis migraine today, Consistent with persistent intractable unilateral R migraine HA x 2 days (prior for days to weeks), chronically over past 2-3+ years - Suspected triggers with weather pressure changes / alcohol - Current HA is mild to improving, well-appearing, no focal neuro deficits, tolerating PO w/o n/v - Seems to have tried adequate OTC analgesia without relief at home (ibuprofen 800, Tylenol 1000, Excedrin Migraine, alternating meds). Never on rx abortive or preventative therapy  Plan: 1. Start abortive therapy with Sumatriptan 50mg  tabs - take 1 PRN (#20, 0 refill), severe HA, may repeat dose within 2 hr if persistent or recurrent, max dose 2 tabs in 24 hours. Within 1-2 weeks patient can notify office if not improved and will instruct to titrate up to 100mg  per dose if no relief at 50mg . Counseled on side effects with chest discomfort. 2. Recommend may still try Ibuprofen 600-800mg  q 8 hr PRN (aftet Sumatriptan) and slowly taper off ibuprofen if not helping, can alternate Excedrin Migraine as well, cautioned against rebound HA. Stop Tylenol. 3. Handout given for recording detailed HA diary for possible triggers, try to avoid known trigger alcohol, and unable to control weather pressure changes 4. Return criteria given if worsening, follow-up 4 weeks review progress, may need Sumatriptan nasal spray or Onzetra or other formulation. Also discuss migraine prophylaxis at next visit - unsure exact choice best for patient (intolerance to zoloft in past, BP is low normal pulse 80s)

## 2016-03-30 NOTE — Assessment & Plan Note (Addendum)
Resolved currently asymptomatic, after prior (07/2015) 3 month trial of Zoloft with dx adjustment disorder (acute stressor, brother in ICU), patient with depressive symptoms at that time.

## 2016-04-02 ENCOUNTER — Ambulatory Visit: Payer: BC Managed Care – PPO | Admitting: Family Medicine

## 2016-04-26 ENCOUNTER — Ambulatory Visit (INDEPENDENT_AMBULATORY_CARE_PROVIDER_SITE_OTHER): Payer: BC Managed Care – PPO | Admitting: Family Medicine

## 2016-04-26 ENCOUNTER — Other Ambulatory Visit: Payer: Self-pay | Admitting: Family Medicine

## 2016-04-26 ENCOUNTER — Encounter: Payer: Self-pay | Admitting: Family Medicine

## 2016-04-26 VITALS — BP 122/69 | HR 77 | Temp 97.6°F | Resp 16 | Ht 62.0 in | Wt 159.0 lb

## 2016-04-26 DIAGNOSIS — Z13220 Encounter for screening for lipoid disorders: Secondary | ICD-10-CM

## 2016-04-26 DIAGNOSIS — Z114 Encounter for screening for human immunodeficiency virus [HIV]: Secondary | ICD-10-CM | POA: Diagnosis not present

## 2016-04-26 DIAGNOSIS — Z0000 Encounter for general adult medical examination without abnormal findings: Secondary | ICD-10-CM

## 2016-04-26 DIAGNOSIS — G43711 Chronic migraine without aura, intractable, with status migrainosus: Secondary | ICD-10-CM

## 2016-04-26 DIAGNOSIS — D649 Anemia, unspecified: Secondary | ICD-10-CM | POA: Insufficient documentation

## 2016-04-26 DIAGNOSIS — E559 Vitamin D deficiency, unspecified: Secondary | ICD-10-CM

## 2016-04-26 MED ORDER — TOPIRAMATE 25 MG PO TABS: 25.0000 mg | ORAL_TABLET | Freq: Every day | ORAL | 5 refills | Status: DC

## 2016-04-26 MED ORDER — SUMATRIPTAN SUCCINATE 11 MG/NOSEPC NA EXHP: 11.0000 mg | INHALANT_POWDER | Freq: Once | NASAL | 5 refills | Status: DC | PRN

## 2016-04-26 NOTE — Patient Instructions (Signed)
Thank you for coming in to clinic today.  1. Stop sumatriptan pills, but you can keep on hand incase - Try to get Isaac BlissOnzetra covered with handouts - If not covered and too expensive, send mychart message, we can switch to generic sumatriptan nasal spray - sent topiramate to pharmacy, 25mg  nightly, wait 1 week or so before starting this so we know how the nasal spray works and to determine side effects, in future can increase to 50mg  if needed - Keep up good work with headache calendar  Continue ibuprofen for now as needed, may help TMJ jaw pain, can check in with dentist  You will be due for FASTING BLOOD WORK (no food or drink after midnight before, only water or coffee without cream/sugar on the morning of)  - Please go ahead and schedule a "Lab Only" visit in the morning at the clinic for lab draw in 4+ weeks - Make sure Lab Only appointment is at least 1-2 weeks before your next appointment, so that results will be available  For Lab Results, once available within 2-3 days of blood draw, you can can log in to MyChart online to view your results and a brief explanation. Also, we can discuss results at next follow-up visit.  Please schedule a follow-up appointment with Dr. Althea CharonKaramalegos in 4-6 weeks for Annual physical (headache follow-up)  If you have any other questions or concerns, please feel free to call the clinic or send a message through MyChart. You may also schedule an earlier appointment if necessary.  [name] Nysia Dell, DO [facility] [place] [facility]

## 2016-04-26 NOTE — Progress Notes (Signed)
Subjective:    Patient ID: Brittany Mosley, female    DOB: 11/11/1985, 31 y.o.   MRN: [id number]  Brittany Sayersmanda D Cavan is a 31 y.o. female presenting on 04/26/2016 for Migraine (follow up )   HPI   Chronic Migraine Headaches: - Last visit with me for initial visit for chronic migraine HA on 03/30/16, see note for detailed background info on headaches, treated with abortive therapy with sumatriptan 50mg  PRN and advised may take ibuprofen high dose up to 600-800 and/or excedrin migraine. 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 Overall reports significant improvement in headaches. She has headache calendar with her, marked with days of headaches and severity. Notably after starting sumatriptan it did dramatically improve her first migraine but required 2nd dose and it returned following day, for 2 more days similar pattern until resolving. HA free for about 1 week, and then return migraine headache, but she started treatment earlier this time with better results, seemed to improve with only 1 dose, and then again nearly HA free for 1 week. She did develop night-time onset migraine HA 12/28 and 12/29, thought to be due to certain holiday stressors. - States sumatriptan side effects for her with worsened heartburn sometimes lasting several days, not necessarily acute chest discomfort upon use, also makes her more drowsy and sleepy after taking, but unsure if this is the migraine HA - Describes headaches same as last time, unilateral, sometimes left or right, often throbbing, difficulty focusing, some associated symptoms with nausea, difficulty focusing, worse with high pitched sounds - Also admits to some generalized headaches, less severe, only 2-3x in past 1 month, took Ibuprofen 800mg  x 1 dose with good results, infrequently required repeat dose. - Tries to be aware of  triggers. Recently changed to keto diet. Reduced alcohol consumption with improvement, does okay on red wine, recent beer made HA worse. - Denies fevers/chills, sweats, nausea, vomiting, abdominal pain, dyspnea  Possible Left TMJ Jaw Pain - Additional recent problem, only bothering her for past few days to week, has not had this problem before, does notice some jaw clicking at times when eating. Has dentist. Does not recall last dental x-rays. No significant recent dental work or other problem   Additionally, requests to follow-up for annual physical with routine labs. No prior history of abnormal glucose, HLD. No known family history of DM, CAD, MI, HLD. Does not know medical history of biological father.   Social History  Substance Use Topics  . Smoking status: Never Smoker  . Smokeless tobacco: Never Used  . Alcohol use No    Review of Systems Per HPI unless specifically indicated above     Objective:    BP 122/69   Pulse 77   Temp 97.6 F (36.4 C) (Oral)   Resp 16   Ht 5\' 2"  (1.575 m)   Wt 159 lb (72.1 kg)   BMI 29.08 kg/m   Wt Readings from Last 3 Encounters:  04/26/16 159 lb (72.1 kg)  03/30/16 156 lb (70.8 kg)  07/31/15 149 lb 12.8 oz (67.9 kg)     Physical Exam  Constitutional: She appears well-developed and well-nourished. No distress.  Well-appearing, comfortable, cooperative  HENT:  Head: Normocephalic and atraumatic.  Mouth/Throat: Oropharynx is clear and moist.  Frontal / maxillary sinuses non-tender. Nares patent without purulence or edema. Oropharynx clear without erythema, exudates, edema  or asymmetry.  Palpable and audible clicking and popping over Left TMJ, some mild symptoms over R TMJ.  Eyes: Conjunctivae and EOM are normal. Pupils are equal, round, and reactive to light. Right eye exhibits no discharge. Left eye exhibits no discharge.  Neck: Normal range of motion. Neck supple. No thyromegaly present.  Cardiovascular: Normal rate, regular rhythm,  normal heart sounds and intact distal pulses.   No murmur heard. Pulmonary/Chest: Effort normal and breath sounds normal. No respiratory distress. She has no wheezes. She has no rales.  Musculoskeletal: Normal range of motion. She exhibits no edema.  Distal muscle strength 5/5 grip and ankles  Lymphadenopathy:    She has no cervical adenopathy.  Neurological: She is alert. No cranial nerve deficit. Coordination normal.  Distal muscle sensation intact to light touch  Skin: Skin is warm and dry. She is not diaphoretic.  Psychiatric: She has a normal mood and affect. Her behavior is normal. Thought content normal.  Nursing note and vitals reviewed.  No results found for this or any previous visit.    Assessment & Plan:   Problem List Items Addressed This Visit    Chronic migraine without aura, with status migrainosus - Primary    Moderately improved chronic migraine HAs (>2-3 years) now 1 month follow-up after initiating sumatriptan. Still migraine HA 1x weekly it seems, recurrence about daily for 1-3 days, but improved on earlier dose of sumatriptan, tolerates well except some heartburn / sedation. Triggers still seem to be stress, alcohol, and weather related. - Currently minimal headache, well appearing - Not on prophylaxis yet  Plan: 1. Switch from Sumatriptan oral 50mg  to nasal delivery for less side effects, quicker onset, trial on Onzetra nasal powder delivery, rx sent to pharmacy for 11mg  nose pieces use 1 in each nostril per dose may repeat dose in 2 hours if needed once in 24 hours for max dose, dispense #16 nasal pieces for 8 uses, +5 refills. Given rx copay coverage card, and manufacturer information. If not covered or unable to afford, can switch to sumatriptan nasal spray as alternative. 2. Recommend may still try Ibuprofen 600-800mg  q 8 hr PRN (after Sumatriptan nasal) continue slowly taper off ibuprofen if not helping, can alternate Excedrin Migraine as well, cautioned against  rebound HA. 3. Start migraine prophylaxis with Topiramate 25mg  nightly low dose - future consider titrate to 50mg , maybe higher, but counseled on potential side effects sedation, groggy, decreased mental function, low doses (200mg  daily) unlikely to reduce effectiveness hormonal contraceptive and cause irregular menstrual cycle 4. Continue keep calendar log of HAs, bring to next visit 5. Return criteria given if worsening, follow-up 4-6 weeks for annual physical, fasting labs, and review progress      Relevant Medications   SUMAtriptan Succinate (ONZETRA XSAIL) 11 MG/NOSEPC EXHP   topiramate (TOPAMAX) 25 MG tablet      Meds ordered this encounter  Medications  . SUMAtriptan Succinate (ONZETRA XSAIL) 11 MG/NOSEPC EXHP    Sig: Place 11 mg into the nose once as needed. Use 2 nose pc (1 on each side) onset migraine, repeat dose in 2 hr as need. Max dose 2 in 24 hr.    Dispense:  16 each    Refill:  5  . topiramate (TOPAMAX) 25 MG tablet    Sig: Take 1 tablet (25 mg total) by mouth at bedtime.    Dispense:  30 tablet    Refill:  5    Follow up plan: Return in about 4 weeks (around  05/24/2016) for Annual Physical (Migraine follow-up).  Saralyn Pilar, DO [facility] 04/26/2016, 10:46 PM

## 2016-04-26 NOTE — Assessment & Plan Note (Addendum)
Moderately improved chronic migraine HAs (>2-3 years) now 1 month follow-up after initiating sumatriptan. Still migraine HA 1x weekly it seems, recurrence about daily for 1-3 days, but improved on earlier dose of sumatriptan, tolerates well except some heartburn / sedation. Triggers still seem to be stress, alcohol, and weather related. - Currently minimal headache, well appearing - Not on prophylaxis yet  Plan: 1. Switch from Sumatriptan oral 50mg  to nasal delivery for less side effects, quicker onset, trial on Onzetra nasal powder delivery, rx sent to pharmacy for 11mg  nose pieces use 1 in each nostril per dose may repeat dose in 2 hours if needed once in 24 hours for max dose, dispense #16 nasal pieces for 8 uses, +5 refills. Given rx copay coverage card, and manufacturer information. If not covered or unable to afford, can switch to sumatriptan nasal spray as alternative. 2. Recommend may still try Ibuprofen 600-800mg  q 8 hr PRN (after Sumatriptan nasal) continue slowly taper off ibuprofen if not helping, can alternate Excedrin Migraine as well, cautioned against rebound HA. 3. Start migraine prophylaxis with Topiramate 25mg  nightly low dose - future consider titrate to 50mg , maybe higher, but counseled on potential side effects sedation, groggy, decreased mental function, low doses (200mg  daily) unlikely to reduce effectiveness hormonal contraceptive and cause irregular menstrual cycle 4. Continue keep calendar log of HAs, bring to next visit 5. Return criteria given if worsening, follow-up 4-6 weeks for annual physical, fasting labs, and review progress

## 2016-04-27 NOTE — Addendum Note (Signed)
Addended by: Smitty CordsKARAMALEGOS, Sabryna Lahm J on: 04/27/2016 11:33 AM   Modules accepted: Orders

## 2016-04-29 ENCOUNTER — Encounter: Payer: Self-pay | Admitting: Family Medicine

## 2016-05-26 ENCOUNTER — Encounter: Payer: Self-pay | Admitting: Family Medicine

## 2016-05-26 DIAGNOSIS — G43711 Chronic migraine without aura, intractable, with status migrainosus: Secondary | ICD-10-CM

## 2016-05-26 MED ORDER — SUMATRIPTAN SUCCINATE 50 MG PO TABS: 50.0000 mg | ORAL_TABLET | Freq: Once | ORAL | 2 refills | Status: DC | PRN

## 2016-06-04 ENCOUNTER — Other Ambulatory Visit: Payer: BC Managed Care – PPO

## 2016-06-04 ENCOUNTER — Other Ambulatory Visit: Payer: Self-pay | Admitting: *Deleted

## 2016-06-04 DIAGNOSIS — Z114 Encounter for screening for human immunodeficiency virus [HIV]: Secondary | ICD-10-CM

## 2016-06-04 DIAGNOSIS — Z0000 Encounter for general adult medical examination without abnormal findings: Secondary | ICD-10-CM

## 2016-06-04 DIAGNOSIS — G43711 Chronic migraine without aura, intractable, with status migrainosus: Secondary | ICD-10-CM

## 2016-06-04 DIAGNOSIS — E559 Vitamin D deficiency, unspecified: Secondary | ICD-10-CM

## 2016-06-04 DIAGNOSIS — D649 Anemia, unspecified: Secondary | ICD-10-CM

## 2016-06-04 DIAGNOSIS — Z13220 Encounter for screening for lipoid disorders: Secondary | ICD-10-CM

## 2016-06-04 LAB — CBC WITH DIFFERENTIAL/PLATELET
BASOS PCT: 0 %
Basophils Absolute: 0 cells/uL (ref 0–200)
EOS ABS: 67 {cells}/uL (ref 15–500)
Eosinophils Relative: 1 %
HEMATOCRIT: 40.5 % (ref 35.0–45.0)
HEMOGLOBIN: 13.7 g/dL (ref 11.7–15.5)
LYMPHS ABS: 2211 {cells}/uL (ref 850–3900)
Lymphocytes Relative: 33 %
MCH: 28.9 pg (ref 27.0–33.0)
MCHC: 33.8 g/dL (ref 32.0–36.0)
MCV: 85.4 fL (ref 80.0–100.0)
MONO ABS: 402 {cells}/uL (ref 200–950)
MPV: 10.3 fL (ref 7.5–12.5)
Monocytes Relative: 6 %
NEUTROS ABS: 4020 {cells}/uL (ref 1500–7800)
Neutrophils Relative %: 60 %
Platelets: 289 10*3/uL (ref 140–400)
RBC: 4.74 MIL/uL (ref 3.80–5.10)
RDW: 13.7 % (ref 11.0–15.0)
WBC: 6.7 10*3/uL (ref 3.8–10.8)

## 2016-06-04 LAB — LIPID PANEL
CHOLESTEROL: 177 mg/dL (ref <200)
HDL: 65 mg/dL (ref >50)
LDL Cholesterol: 98 mg/dL (ref <100)
TRIGLYCERIDES: 70 mg/dL (ref <150)
Total CHOL/HDL Ratio: 2.7 Ratio (ref <5.0)
VLDL: 14 mg/dL (ref <30)

## 2016-06-04 LAB — COMPLETE METABOLIC PANEL WITH GFR
ALBUMIN: 4 g/dL (ref 3.6–5.1)
ALK PHOS: 50 U/L (ref 33–115)
ALT: 8 U/L (ref 6–29)
AST: 11 U/L (ref 10–30)
BILIRUBIN TOTAL: 0.4 mg/dL (ref 0.2–1.2)
BUN: 20 mg/dL (ref 7–25)
CALCIUM: 9.3 mg/dL (ref 8.6–10.2)
CHLORIDE: 108 mmol/L (ref 98–110)
CO2: 22 mmol/L (ref 20–31)
CREATININE: 0.93 mg/dL (ref 0.50–1.10)
GFR, Est Non African American: 82 mL/min (ref >=60)
Glucose, Bld: 80 mg/dL (ref 65–99)
Potassium: 4.1 mmol/L (ref 3.5–5.3)
Sodium: 140 mmol/L (ref 135–146)
Total Protein: 6.9 g/dL (ref 6.1–8.1)

## 2016-06-05 LAB — HIV ANTIBODY (ROUTINE TESTING W REFLEX): HIV 1&2 Ab, 4th Generation: NONREACTIVE

## 2016-06-05 LAB — VITAMIN D 25 HYDROXY (VIT D DEFICIENCY, FRACTURES): Vit D, 25-Hydroxy: 52 ng/mL (ref 30–100)

## 2016-06-11 ENCOUNTER — Other Ambulatory Visit: Payer: Self-pay | Admitting: *Deleted

## 2016-06-11 ENCOUNTER — Ambulatory Visit (INDEPENDENT_AMBULATORY_CARE_PROVIDER_SITE_OTHER): Payer: BC Managed Care – PPO | Admitting: Family Medicine

## 2016-06-11 ENCOUNTER — Encounter: Payer: Self-pay | Admitting: Family Medicine

## 2016-06-11 VITALS — BP 107/60 | HR 65 | Temp 98.4°F | Resp 16 | Ht 62.0 in | Wt 153.0 lb

## 2016-06-11 DIAGNOSIS — Z0000 Encounter for general adult medical examination without abnormal findings: Secondary | ICD-10-CM | POA: Diagnosis not present

## 2016-06-11 DIAGNOSIS — G43711 Chronic migraine without aura, intractable, with status migrainosus: Secondary | ICD-10-CM | POA: Diagnosis not present

## 2016-06-11 MED ORDER — SUMATRIPTAN 20 MG/ACT NA SOLN: 20.0000 mg | NASAL | 2 refills | Status: DC | PRN

## 2016-06-11 MED ORDER — TOPIRAMATE 25 MG PO TABS: 25.0000 mg | ORAL_TABLET | Freq: Two times a day (BID) | ORAL | 5 refills | Status: DC

## 2016-06-11 MED ORDER — SUMATRIPTAN SUCCINATE 50 MG PO TABS: 50.0000 mg | ORAL_TABLET | Freq: Once | ORAL | 5 refills | Status: DC | PRN

## 2016-06-11 NOTE — Progress Notes (Signed)
Subjective:    Patient ID: Brittany Mosley, female    DOB: 1985-07-09, 31 y.o.   MRN: [id number]  Brittany Mosley is a 32 y.o. female presenting on 06/11/2016 for Annual Exam   HPI   Annual Physical - No new concerns today. Reviewed lab results, all were normal. - No significant recent lifestyle changes  Follow-up - Chronic Migraine Headaches: - Last visit 04/26/16, for continued management of migraines, she has attempted Onzetra (sumatriptan nasal powder) but could not tolerate the delivery of the powder with significant side effects of nasal burning and nausea, problem with the medicine going back into her mouth, she has stopped this. In interval she has been on sumatriptan tabs, and also Topamax for prevention. - Today she reports overall doing much better than prior to starting migraine medications back in December 2017. She kept a detailed HA calendar, but did not bring it today she has a close approximation of the dates but not full calendar. - For abortive therapy: Sumatriptan '50mg'$  tabs worked well initially would resolve acute early migraine, but often would have a rebound headache hours later in the day or next day for several days. However, after starting Topamax, the initial Sumatriptan dose would resolve the headache usually for at least several days or longer (also reached quantity limit for sumatriptan max #36 in 75 days, she needed last rx changed from #30 to #12 for monthly) - For preventative therapy: On Topamax '25mg'$  nightly since 04/26/16, with some good results, especially early in January with mostly no headaches, then worse in February, on 05/26/16 advised to increase to '50mg'$  (x2 tabs in evening), however she had some side effects with mild tingling in fingertips and feet, worse fingers seemed mild and episodic, otherwise no other side effects, no grogginess, sedation, or cognition problems, switched to '25mg'$  BID with some better results - Still takes ibuprofen at very first onset of  HA for minor HAs not migraines - Biggest trigger recently seems to be weather changes with rain and pressure changes - Denies fevers/chills, sweats, nausea, vomiting, abdominal pain, dyspnea, active headache, vision or hearing changes, active numbness tingling or weakness  Health Maintenance: - Declines TDap and Flu vaccine despite counseling today   Past Medical History:  Diagnosis Date  . Anemia   . Headache    Past Surgical History:  Procedure Laterality Date  . CESAREAN SECTION  2010-2012   Social History   Social History  . Marital status: Single    Spouse name: N/A  . Number of children: N/A  . Years of education: N/A   Occupational History  . Teacher    Social History Main Topics  . Smoking status: Never Smoker  . Smokeless tobacco: Never Used  . Alcohol use No  . Drug use: No  . Sexual activity: Not on file   Other Topics Concern  . Not on file   Social History Narrative  . No narrative on file   Family History  Problem Relation Age of Onset  . Rheum arthritis Mother   . Hearing loss Mother 20    one ear  . Alzheimer's disease Maternal Grandmother   . Heart disease Maternal Grandfather 85   Current Outpatient Prescriptions on File Prior to Visit  Medication Sig  . fluticasone (FLONASE) 50 MCG/ACT nasal spray Place 2 sprays into both nostrils daily.  Marland Kitchen loratadine (CLARITIN) 10 MG tablet Take 1 tablet (10 mg total) by mouth daily. (Patient taking differently: Take 10 mg by mouth  as needed. )  . etonogestrel-ethinyl estradiol (NUVARING) 0.12-0.015 MG/24HR vaginal ring Place 1 each vaginally every 28 (twenty-eight) days. Reported on 07/31/2015   No current facility-administered medications on file prior to visit.     Review of Systems  Constitutional: Negative for activity change, appetite change, chills, diaphoresis, fatigue, fever and unexpected weight change.  HENT: Negative for congestion, hearing loss and sinus pressure.   Eyes: Negative for visual  disturbance.  Respiratory: Negative for cough, chest tightness, shortness of breath and wheezing.   Cardiovascular: Negative for chest pain, palpitations and leg swelling.  Gastrointestinal: Negative for abdominal pain, constipation, diarrhea, nausea and vomiting.  Endocrine: Negative for cold intolerance and polyuria.  Genitourinary: Negative for dysuria, frequency and hematuria.  Musculoskeletal: Negative for arthralgias and neck pain.  Skin: Negative for rash.  Allergic/Immunologic: Negative for environmental allergies.  Neurological: Positive for headaches (migraines, not active). Negative for dizziness, weakness, light-headedness and numbness.  Hematological: Negative for adenopathy.  Psychiatric/Behavioral: Negative for behavioral problems, dysphoric mood and sleep disturbance. The patient is not nervous/anxious.    Per HPI unless specifically indicated above     Objective:    BP 107/60   Pulse 65   Temp 98.4 F (36.9 C) (Oral)   Resp 16   Ht '5\' 2"'$  (1.575 m)   Wt 153 lb (69.4 kg)   LMP 05/21/2016   BMI 27.98 kg/m   Wt Readings from Last 3 Encounters:  06/11/16 153 lb (69.4 kg)  04/26/16 159 lb (72.1 kg)  03/30/16 156 lb (70.8 kg)    Physical Exam  Constitutional: She is oriented to person, place, and time. She appears well-developed and well-nourished. No distress.  Well-appearing, comfortable, cooperative  HENT:  Head: Normocephalic and atraumatic.  Mouth/Throat: Oropharynx is clear and moist.  Frontal / maxillary sinuses non-tender. Oropharynx clear without erythema, exudates, edema or asymmetry.  Eyes: Conjunctivae and EOM are normal.  Neck: Normal range of motion. Neck supple. No thyromegaly present.  Cardiovascular: Normal rate, regular rhythm, normal heart sounds and intact distal pulses.   No murmur heard. Pulmonary/Chest: Effort normal and breath sounds normal. No respiratory distress. She has no wheezes. She has no rales.  Good air movement  Abdominal: She  exhibits no distension.  Musculoskeletal: Normal range of motion. She exhibits no edema or tenderness.  Bilateral Hands Inspection: Normal appearance, symmetrical, no bulky MCP joints, no edema or erythema. Palpation: Non tender hand / wrist, carpal bones, including MCP, base of thumb. No distinct anatomical snuff box or scaphoid tenderness ROM: full active wrist ROM flex / ext, ulnar / radial deviation Special Testing: Negative bilateral Tinel's median nerve and ulnar nerves Strength: 5/5 grip, thumb opposition, wrist flex/ext Neurovascular: distally intact  Lymphadenopathy:    She has no cervical adenopathy.  Neurological: She is alert and oriented to person, place, and time. No cranial nerve deficit.  Distal sensation to light touch intact  Skin: Skin is warm and dry. She is not diaphoretic.  Psychiatric: She has a normal mood and affect. Her behavior is normal.  Nursing note and vitals reviewed.    I have personally reviewed the following lab results from 06/04/16.  Results for orders placed or performed in visit on 06/04/16  COMPLETE METABOLIC PANEL WITH GFR  Result Value Ref Range   Sodium 140 135 - 146 mmol/L   Potassium 4.1 3.5 - 5.3 mmol/L   Chloride 108 98 - 110 mmol/L   CO2 22 20 - 31 mmol/L   Glucose, Bld 80 65 -  99 mg/dL   BUN 20 7 - 25 mg/dL   Creat 0.93 0.50 - 1.10 mg/dL   Total Bilirubin 0.4 0.2 - 1.2 mg/dL   Alkaline Phosphatase 50 33 - 115 U/L   AST 11 10 - 30 U/L   ALT 8 6 - 29 U/L   Total Protein 6.9 6.1 - 8.1 g/dL   Albumin 4.0 3.6 - 5.1 g/dL   Calcium 9.3 8.6 - 10.2 mg/dL   GFR, Est African American >89 >=60 mL/min   GFR, Est Non African American 82 >=60 mL/min  Lipid panel  Result Value Ref Range   Cholesterol 177 <200 mg/dL   Triglycerides 70 <150 mg/dL   HDL 65 >50 mg/dL   Total CHOL/HDL Ratio 2.7 <5.0 Ratio   VLDL 14 <30 mg/dL   LDL Cholesterol 98 <100 mg/dL  VITAMIN D 25 Hydroxy (Vit-D Deficiency, Fractures)  Result Value Ref Range   Vit D,  25-Hydroxy 52 30 - 100 ng/mL  CBC with Differential/Platelet  Result Value Ref Range   WBC 6.7 3.8 - 10.8 K/uL   RBC 4.74 3.80 - 5.10 MIL/uL   Hemoglobin 13.7 11.7 - 15.5 g/dL   HCT 40.5 35.0 - 45.0 %   MCV 85.4 80.0 - 100.0 fL   MCH 28.9 27.0 - 33.0 pg   MCHC 33.8 32.0 - 36.0 g/dL   RDW 13.7 11.0 - 15.0 %   Platelets 289 140 - 400 K/uL   MPV 10.3 7.5 - 12.5 fL   Neutro Abs 4,020 1,500 - 7,800 cells/uL   Lymphs Abs 2,211 850 - 3,900 cells/uL   Monocytes Absolute 402 200 - 950 cells/uL   Eosinophils Absolute 67 15 - 500 cells/uL   Basophils Absolute 0 0 - 200 cells/uL   Neutrophils Relative % 60 %   Lymphocytes Relative 33 %   Monocytes Relative 6 %   Eosinophils Relative 1 %   Basophils Relative 0 %   Smear Review Criteria for review not met   HIV antibody  Result Value Ref Range   HIV 1&2 Ab, 4th Generation NONREACTIVE NONREACTIVE      Assessment & Plan:   Problem List Items Addressed This Visit    Chronic migraine without aura, with status migrainosus    Still notable improvement chronic migraine HAs, now on Sumatriptan 50 and Topamax 25 BID (less tingling side effect), fewer migraines and initial Sumatriptan abortive therapy usually resolves, no further rebound HAs. - Triggers: significant recent noticed weather/pressure/rain, stress, alcohol - Currently no headache, well appearing  Plan: 1. Continue current therapy - refilled Sumatriptan '50mg'$  tabs take one acute migraine repeat 2 hrs if recurs, #12 +5 refills printed (initially sent to pharmacy, called to cancel e-script due to quantity limit per patient, last rx #30 tabs and not covered, LIMIT 36 tabs per 75 days) 2. Given quantity limit patient also interested to try Sumatriptan Nasal spray, printed rx '20mg'$  (one nostril) same abortive instructions, +2 refills, unsure if counts against same sumatriptan quantity limit 3. Refilled Topamax new rx '25mg'$  BID #60, +5 refills - counseling on med interaction with estrogen hormones  for OCP, can reduce effectiveness, however advised less likely to be significant at current lower dose, interaction describes higher risk dose Topamax >'200mg'$  4. May still use ibuprofen 600-800 PRN non migraine HA 5. Continue HA diary, bring to next visit if significant 6. Follow-up 6 months for migraine management, sooner if worsening, can discuss alternative preventative therapy in future if needed  Relevant Medications   topiramate (TOPAMAX) 25 MG tablet    Other Visit Diagnoses    Annual physical exam    -  Primary      Meds ordered this encounter  Medications  . topiramate (TOPAMAX) 25 MG tablet    Sig: Take 1 tablet (25 mg total) by mouth 2 (two) times daily.    Dispense:  60 tablet    Refill:  5  . : SUMAtriptan (IMITREX) 20 MG/ACT nasal spray    Sig: Place 1 spray (20 mg total) into the nose every 2 (two) hours as needed for migraine or headache. May repeat in 2 hours if headache recurs.    Dispense:  1 Inhaler    Refill:  2  . : SUMAtriptan (IMITREX) 50 MG tablet    Sig: Take 1 tablet (50 mg total) by mouth once as needed for migraine. May repeat one dose in 2 hours if persists, for max dose 24 hr    Dispense:  12 tablet    Refill:  5      Follow up plan: Return in about 6 months (around 12/09/2016) for Migraine.  Nobie Putnam, [facility] 06/12/2016, 7:27 AM

## 2016-06-11 NOTE — Patient Instructions (Signed)
Thank you for coming in to clinic today.  1. Refilled Sumatriptan 50mg  tabs #12 per rx / max limit 36 pills per 75 days  Sent new rx Sumatriptan Nasal spray 20mg  (higher dose) x 1 use (one nostril, alternate), let me know if any issues getting from the pharmacy  Topamax 25mg  tabs, take twice daily  Please schedule a follow-up appointment with Dr. Althea CharonKaramalegos in 6 months for Migraines  If you have any other questions or concerns, please feel free to call the clinic or send a message through MyChart. You may also schedule an earlier appointment if necessary.  [name] Karamalegos, DO [facility]

## 2016-06-12 ENCOUNTER — Encounter: Payer: Self-pay | Admitting: Family Medicine

## 2016-06-12 NOTE — Assessment & Plan Note (Signed)
Still notable improvement chronic migraine HAs, now on Sumatriptan 50 and Topamax 25 BID (less tingling side effect), fewer migraines and initial Sumatriptan abortive therapy usually resolves, no further rebound HAs. - Triggers: significant recent noticed weather/pressure/rain, stress, alcohol - Currently no headache, well appearing  Plan: 1. Continue current therapy - refilled Sumatriptan 50mg  tabs take one acute migraine repeat 2 hrs if recurs, #12 +5 refills printed (initially sent to pharmacy, called to cancel e-script due to quantity limit per patient, last rx #30 tabs and not covered, LIMIT 36 tabs per 75 days) 2. Given quantity limit patient also interested to try Sumatriptan Nasal spray, printed rx 20mg  (one nostril) same abortive instructions, +2 refills, unsure if counts against same sumatriptan quantity limit 3. Refilled Topamax new rx 25mg  BID #60, +5 refills - counseling on med interaction with estrogen hormones for OCP, can reduce effectiveness, however advised less likely to be significant at current lower dose, interaction describes higher risk dose Topamax >200mg  4. May still use ibuprofen 600-800 PRN non migraine HA 5. Continue HA diary, bring to next visit if significant 6. Follow-up 6 months for migraine management, sooner if worsening, can discuss alternative preventative therapy in future if needed

## 2017-01-19 ENCOUNTER — Other Ambulatory Visit: Payer: Self-pay | Admitting: Family Medicine

## 2017-01-19 DIAGNOSIS — G43711 Chronic migraine without aura, intractable, with status migrainosus: Secondary | ICD-10-CM

## 2017-03-09 ENCOUNTER — Other Ambulatory Visit: Payer: Self-pay | Admitting: Family Medicine

## 2017-03-09 DIAGNOSIS — G43711 Chronic migraine without aura, intractable, with status migrainosus: Secondary | ICD-10-CM

## 2017-06-06 ENCOUNTER — Ambulatory Visit (INDEPENDENT_AMBULATORY_CARE_PROVIDER_SITE_OTHER): Payer: BC Managed Care – PPO | Admitting: Nurse Practitioner

## 2017-06-06 ENCOUNTER — Other Ambulatory Visit: Payer: Self-pay

## 2017-06-06 ENCOUNTER — Encounter: Payer: Self-pay | Admitting: Nurse Practitioner

## 2017-06-06 VITALS — BP 112/67 | HR 88 | Temp 98.4°F | Ht 62.0 in | Wt 164.4 lb

## 2017-06-06 DIAGNOSIS — H6123 Impacted cerumen, bilateral: Secondary | ICD-10-CM | POA: Diagnosis not present

## 2017-06-06 DIAGNOSIS — H65193 Other acute nonsuppurative otitis media, bilateral: Secondary | ICD-10-CM | POA: Diagnosis not present

## 2017-06-06 MED ORDER — AMOXICILLIN-POT CLAVULANATE 875-125 MG PO TABS: 1.0000 | ORAL_TABLET | Freq: Two times a day (BID) | ORAL | 0 refills | Status: AC

## 2017-06-06 NOTE — Progress Notes (Signed)
Subjective:    Patient ID: Brittany Mosley, female    DOB: 1985/10/04, 32 y.o.   MRN: [id number]  Brittany Mosley is a 32 y.o. female presenting on 06/06/2017 for Ear Pain (sharp pain mostly in the Right w/ fullness feeling x 4 days )   HPI Ear pain Pt presents today for r ear pain with sharp pains occasionally over last 4 days.  Pt notes started on Fri night r ear felt a bit sore, Sat blocked, then muffled sounds and sharp pain  Now starting on L.  No other URI symptoms.  Denies rhinorrhea, sinus/nasal congestion, post nasal drip, cough, sore throat, fever, chills, sweats, nausea, vomiting, and diarrhea.  Has had no other known sick contacts.  Social History   Tobacco Use  . Smoking status: Never Smoker  . Smokeless tobacco: Never Used  Substance Use Topics  . Alcohol use: No    Alcohol/week: 0.0 oz  . Drug use: No    Review of Systems Per HPI unless specifically indicated above     Objective:    BP 112/67 (BP Location: Right Arm, Patient Position: Sitting, Cuff Size: Normal)   Pulse 88   Temp 98.4 F (36.9 C) (Oral)   Ht 5\' 2"  (1.575 m)   Wt 164 lb 6.4 oz (74.6 kg)   BMI 30.07 kg/m   Wt Readings from Last 3 Encounters:  06/06/17 164 lb 6.4 oz (74.6 kg)  06/11/16 153 lb (69.4 kg)  04/26/16 159 lb (72.1 kg)    Physical Exam  General - overweight, well-appearing, NAD HEENT - Normocephalic, atraumatic, PERRL, EOMI, patent nares w/o congestion, oropharynx clear, MMM, TM erythematous and bulging, Ear canal with cerumen impaction, erythematous after irrigation bilaterally and R ear with remaining partial obstruction, external ear normal w/o lesions Neck - supple, non-tender, postauricular and preauricular LAD Heart - RRR, no murmurs heard Lungs - Clear throughout all lobes, no wheezing, crackles, or rhonchi. Normal work of breathing. Extremeties - non-tender, no edema, cap refill < 2 seconds, peripheral pulses intact +2 bilaterally Skin - warm, dry, no rashes Neuro -  awake, alert, oriented x3, normal gait Psych - Normal mood and affect, normal behavior       Assessment & Plan:   Problem List Items Addressed This Visit    None    Visit Diagnoses    Other acute nonsuppurative otitis media of both ears, recurrence not specified    -  Primary Consistent with bilateral AOM on exam without effusion or perforation. No recent URI or antibiotics. No prior known AOM. Currently afebrile, well-appearing and non-toxic, well hydrated on exam.  Plan: 1. Start augmentin 875-125 mg tablet bid x 10 days  2. Motrin q 6 hrs 2-3 days PRN 3. Improve hydration, regular diet as tolerated 4. For cough, warm camomile tea with honey 6. Return criteria given    Relevant Medications   amoxicillin-clavulanate (AUGMENTIN) 875-125 MG tablet   Bilateral impacted cerumen     Bilateral ear cerumen impaction with dark, hardened ear wax obscuring TM.  Irrigation performed with hydrogen peroxide, warm water solution.  Fully removed cerumen from L ear.  Partially removed cerumen from R ear.    Plan: 1. Place ear wax softening drops into R ear nightly x 7-10 nights.  Then irrigate at home or return to clinic. 2. Avoid use of Q-tips and earbuds/ear plugs. 3. Followup as needed.      Meds ordered this encounter  Medications  . amoxicillin-clavulanate (AUGMENTIN) 875-125 MG tablet  Sig: Take 1 tablet by mouth 2 (two) times daily for 10 days.    Dispense:  20 tablet    Refill:  0    Order Specific Question:   Supervising Provider    Answer:   Brittany Mosley [[id number]]     Follow up plan: Return 5-7 days if symptoms worsen or fail to improve.   Brittany McardleLauren Dayleen Beske, DNP, AGPCNP-BC Adult Gerontology Primary Care Nurse Practitioner [facility] 06/15/2017, 9:18 PM

## 2017-06-06 NOTE — Patient Instructions (Addendum)
Brittany Mosley, Thank you for coming in to clinic today.  1. For your ear wax: - START using over the counter ear wax softener drops (Debrox or Murine).  Use these at least once every week to keep your ear wax managed. Use a tissue or washcloth to remove wax from the opening of your ear.  Do not use q-tips.  2. You also have bilateral acute otitis media - ear infection. - START taking augmentin 875-125 mg twice daily for 10 days.  Please schedule a follow-up appointment with Wilhelmina McardleLauren Lezley Bedgood, AGNP. Return 5-7 days if symptoms worsen or fail to improve.   If you have any other questions or concerns, please feel free to call the clinic or send a message through MyChart. You may also schedule an earlier appointment if necessary.  You will receive a survey after today's visit either digitally by e-mail or paper by [employer] mail. Your experiences and feedback matter to us.  Please respond so we know how we are doing as we provide care for you.   Wilhelmina McardleLauren Ireland Virrueta, DNP, AGNP-BC Adult Gerontology Nurse Practitioner [facility]

## 2017-06-17 ENCOUNTER — Ambulatory Visit: Payer: BC Managed Care – PPO | Admitting: Family Medicine

## 2017-06-17 ENCOUNTER — Encounter: Payer: Self-pay | Admitting: Family Medicine

## 2017-06-17 VITALS — BP 116/64 | HR 79 | Temp 98.3°F | Resp 16 | Ht 62.0 in | Wt 166.8 lb

## 2017-06-17 DIAGNOSIS — H6121 Impacted cerumen, right ear: Secondary | ICD-10-CM | POA: Diagnosis not present

## 2017-06-17 DIAGNOSIS — G43719 Chronic migraine without aura, intractable, without status migrainosus: Secondary | ICD-10-CM | POA: Diagnosis not present

## 2017-06-17 MED ORDER — SUMATRIPTAN SUCCINATE 50 MG PO TABS: ORAL_TABLET | ORAL | 5 refills | Status: DC

## 2017-06-17 NOTE — Progress Notes (Signed)
Subjective:    Patient ID: Brittany Mosley, female    DOB: [birth year], 32 y.o.   MRN: [id number]  Brittany Mosley is a 32 y.o. female presenting on 06/17/2017 for Migraine (needs refill)   HPI  Follow-up - Chronic Migraine Headaches: - Last visit with me 06/11/16, for annual physical and migraine review, treated with continued Sumatriptan oral (failed Nasal Sumatriptan ineffective) and continued Topamax for prophylaxis , see prior notes for background information. - Interval update over past 1 year has stopped Topamax 35m BID due to ineffective after >6 months of use, did not get renewed, unsure if actually preventing migraines, noticed no difference in frequency, had no side effects either - Today patient reports she is now primarily using Sumatriptan for abortive migraine relief PRN with good results. - Reviews course of her migraines, has a cluster of migraines that occur over 2-3 days usually, then will have 1 or more weeks without headache. Describes predictable onset usually trigger is weather related. - She will try to first take Ibuprofen 800-10081mPRN headache initially with some mild relief, rarely fully resolves - Next she will take Sumatriptan 5055m 1 dose usually at night and then sleep resolves most migraines, may occasionally have recurrence within 12 to 24 hours will repeat dose. She does NOT take 2nd pill at once for 100m64mse, and she has not repeated dose within 2 hours. - She is using about 8-10 pills per month on avg, recently has been better, last migraine >1-2 months ago, has been doing well - No longer interested in other prophylaxis therapy - Denies fevers/chills, sweats, nausea, vomiting, abdominal pain, dyspnea, active headache, vision or hearing changes, active numbness tingling or weakness  Follow-up Ear Cerumen Impaction / AOM Recently seen on 06/06/17 treated for bilateral cerumen impaction and possible AOM, covered with Augmentin antibiotic and had ear lavage in  office, partial removal, incomplete R ear, has done better since this and still has ear wax in R. Using drops at home. Finished antibiotic   Additionally: She has prior Annual Physical last year with me and with GYN [facility]armacist[facility] she had labs through our office. She is asking if she can see GYN in future for annual and here for refills. She remains on OCP  Health Maintenance: UTD Flu Vaccine this season UTD routine HIV screen Deferred TDap UTD Pap smear  Depression screen PHQ 2/9 06/17/2017  Decreased Interest 0  Down, Depressed, Hopeless 0  PHQ - 2 Score 0    Social History   Tobacco Use  . Smoking status: Never Smoker  . Smokeless tobacco: Never Used  Substance Use Topics  . Alcohol use: No    Alcohol/week: 0.0 oz  . Drug use: No    Review of Systems Per HPI unless specifically indicated above     Objective:    BP 116/64   Pulse 79   Temp 98.3 F (36.8 C) (Oral)   Resp 16   Ht _0  (1.575 m)   Wt 166 lb 12.8 oz (75.7 kg)   BMI 30.51 kg/m   Wt Readings from Last 3 Encounters:  06/17/17 166 lb 12.8 oz (75.7 kg)  06/06/17 164 lb 6.4 oz (74.6 kg)  06/11/16 153 lb (69.4 kg)    Physical Exam  Constitutional: She is oriented to person, place, and time. She appears well-developed and well-nourished. No distress.  Well-appearing, comfortable, cooperative  HENT:  Head: Normocephalic and atraumatic.  Mouth/Throat: Oropharynx is clear and  moist.  Frontal / maxillary sinuses non-tender. Nares patent without purulence or edema.  R TM with still limited view of TM mostly obscured by thick dry impacted cerumen, some minor abrasion to ear canal is healing w/o active bleed or other complication  L TM is normal clear without erythema, effusion or bulging. Mild soft cerumen L canal.  Oropharynx clear without erythema, exudates, edema or asymmetry.  Eyes: Conjunctivae are normal. Right eye exhibits no discharge. Left eye exhibits no discharge.    Neck: Normal range of motion. Neck supple. No thyromegaly present.  Cardiovascular: Normal rate, regular rhythm, normal heart sounds and intact distal pulses.  No murmur heard. Pulmonary/Chest: Effort normal and breath sounds normal. No respiratory distress. She has no wheezes. She has no rales.  Musculoskeletal: Normal range of motion. She exhibits no edema.  Lymphadenopathy:    She has no cervical adenopathy.  Neurological: She is alert and oriented to person, place, and time.  Skin: Skin is warm and dry. No rash noted. She is not diaphoretic. No erythema.  Psychiatric: She has a normal mood and affect. Her behavior is normal.  Well groomed, good eye contact, normal speech and thoughts  Nursing note and vitals reviewed.    Results for orders placed or performed in visit on 06/04/16  COMPLETE METABOLIC PANEL WITH GFR  Result Value Ref Range   Sodium 140 135 - 146 mmol/L   Potassium 4.1 3.5 - 5.3 mmol/L   Chloride 108 98 - 110 mmol/L   CO2 22 20 - 31 mmol/L   Glucose, Bld 80 65 - 99 mg/dL   BUN 20 7 - 25 mg/dL   Creat 0.93 0.50 - 1.10 mg/dL   Total Bilirubin 0.4 0.2 - 1.2 mg/dL   Alkaline Phosphatase 50 33 - 115 U/L   AST 11 10 - 30 U/L   ALT 8 6 - 29 U/L   Total Protein 6.9 6.1 - 8.1 g/dL   Albumin 4.0 3.6 - 5.1 g/dL   Calcium 9.3 8.6 - 10.2 mg/dL   GFR, Est African American >89 >=60 mL/min   GFR, Est Non African American 82 >=60 mL/min  Lipid panel  Result Value Ref Range   Cholesterol 177 <200 mg/dL   Triglycerides 70 <150 mg/dL   HDL 65 >50 mg/dL   Total CHOL/HDL Ratio 2.7 <5.0 Ratio   VLDL 14 <30 mg/dL   LDL Cholesterol 98 <100 mg/dL  VITAMIN D 25 Hydroxy (Vit-D Deficiency, Fractures)  Result Value Ref Range   Vit D, 25-Hydroxy 52 30 - 100 ng/mL  CBC with Differential/Platelet  Result Value Ref Range   WBC 6.7 3.8 - 10.8 K/uL   RBC 4.74 3.80 - 5.10 MIL/uL   Hemoglobin 13.7 11.7 - 15.5 g/dL   HCT 40.5 35.0 - 45.0 %   MCV 85.4 80.0 - 100.0 fL   MCH 28.9 27.0 -  33.0 pg   MCHC 33.8 32.0 - 36.0 g/dL   RDW 13.7 11.0 - 15.0 %   Platelets 289 140 - 400 K/uL   MPV 10.3 7.5 - 12.5 fL   Neutro Abs 4,020 1,500 - 7,800 cells/uL   Lymphs Abs 2,211 850 - 3,900 cells/uL   Monocytes Absolute 402 200 - 950 cells/uL   Eosinophils Absolute 67 15 - 500 cells/uL   Basophils Absolute 0 0 - 200 cells/uL   Neutrophils Relative % 60 %   Lymphocytes Relative 33 %   Monocytes Relative 6 %   Eosinophils Relative 1 %   Basophils  Relative 0 %   Smear Review Criteria for review not met   HIV antibody  Result Value Ref Range   HIV 1&2 Ab, 4th Generation NONREACTIVE NONREACTIVE      Assessment & Plan:   Problem List Items Addressed This Visit    Chronic migraine without aura, with status migrainosus - Primary    Currently stable chronic migraines w/o aura Controlled on abortive PRN sumatriptan / ibuprofen only Triggers: weather/pressure/rain, stress, alcohol Meds - Failed Topamax (prophylaxis, 25 BID - ineffective), Failed Sumatriptan Nasal Spray (side effect, ineffective)  Plan 1. Refilled Sumatriptan 38m take 1 PRN - may repeat 2 hr, #12 pills +5 refills up to 6 mo supply, maybe longer if using less often, notify office for refills when ready - may not need to take dose up to 1078mif not needed - May still use Ibuprofen 600-8007mRN mild or initial HA - May monitor HA diary if helping keep track - Follow-up yearly for review of migraines, sooner as needed if breakthrough - can review other prophylaxis meds if interested again in future      Relevant Medications   SUMAtriptan (IMITREX) 50 MG tablet    Other Visit Diagnoses    Right ear impacted cerumen     Still persistent impaction, some softening with OTC drops and prior flushing No obvious complication or abrasion, seems healed Unable to view TM adequately Reassurance, continue OTC drops and home flush May return or ENT if needed       Meds ordered this encounter  Medications  . SUMAtriptan  (IMITREX) 50 MG tablet    Sig: TAKE 1 TABLET BY MOUTH ONCE AS NEEDED FOR MIGRAINE. MAY REPEAT ONE DOSE IN 2 HOURS IF PERSISTS FOR A MAX DOSE OF 2 TABLETS PER 24 HOURS.    Dispense:  12 tablet    Refill:  5    Follow up plan: Return in about 1 year (around 06/18/2018) for migraine med refill.  Advised she may proceed with annual physical [facility] can draw basic labs chemistry CBC, otherwise we can check labs in future through our office  AleNobie PutnamO [place] 06/17/2017, 2:41 PM

## 2017-06-17 NOTE — Assessment & Plan Note (Signed)
Currently stable chronic migraines w/o aura Controlled on abortive PRN sumatriptan / ibuprofen only Triggers: weather/pressure/rain, stress, alcohol Meds - Failed Topamax (prophylaxis, 25 BID - ineffective), Failed Sumatriptan Nasal Spray (side effect, ineffective)  Plan 1. Refilled Sumatriptan 50mg  take 1 PRN - may repeat 2 hr, #12 pills +5 refills up to 6 mo supply, maybe longer if using less often, notify office for refills when ready - may not need to take dose up to 100mg  if not needed - May still use Ibuprofen 600-800mg  PRN mild or initial HA - May monitor HA diary if helping keep track - Follow-up yearly for review of migraines, sooner as needed if breakthrough - can review other prophylaxis meds if interested again in future

## 2017-06-17 NOTE — Patient Instructions (Addendum)
Thank you for coming to the office today.  1.  Keep up the good work overall  I refilled Sumatriptan 50mg  - 12 pills per bottle added 5 refills - if going well and need more refills, contact pharmacy/us as needed  GYN for annual physical - ask if they can check chemistry - Complete Metabolic Panel (other option - CBC blood counts)   Please schedule a Follow-up Appointment to: Return in about 1 year (around 06/18/2018) for migraine med refill.   If you have any other questions or concerns, please feel free to call the office or send a message through MyChart. You may also schedule an earlier appointment if necessary.  Additionally, you may be receiving a survey about your experience at our office within a few days to 1 week by e-mail or mail. We value your feedback.  [name] Karamalegos, DO [facility]

## 2017-06-27 ENCOUNTER — Other Ambulatory Visit: Payer: Self-pay | Admitting: Family Medicine

## 2017-06-27 DIAGNOSIS — G43711 Chronic migraine without aura, intractable, with status migrainosus: Secondary | ICD-10-CM

## 2018-03-01 ENCOUNTER — Encounter: Payer: Self-pay | Admitting: Family Medicine

## 2018-03-01 ENCOUNTER — Telehealth: Payer: Self-pay | Admitting: Family Medicine

## 2018-03-01 ENCOUNTER — Ambulatory Visit: Payer: BC Managed Care – PPO | Admitting: Family Medicine

## 2018-03-01 VITALS — BP 98/56 | HR 97 | Temp 99.1°F | Resp 16 | Ht 62.0 in | Wt 163.0 lb

## 2018-03-01 DIAGNOSIS — R112 Nausea with vomiting, unspecified: Secondary | ICD-10-CM | POA: Diagnosis not present

## 2018-03-01 DIAGNOSIS — R509 Fever, unspecified: Secondary | ICD-10-CM

## 2018-03-01 DIAGNOSIS — J101 Influenza due to other identified influenza virus with other respiratory manifestations: Secondary | ICD-10-CM | POA: Diagnosis not present

## 2018-03-01 LAB — POCT INFLUENZA A/B
INFLUENZA A, POC: POSITIVE — AB
INFLUENZA B, POC: NEGATIVE

## 2018-03-01 MED ORDER — BENZONATATE 100 MG PO CAPS: 100.0000 mg | ORAL_CAPSULE | Freq: Three times a day (TID) | ORAL | 0 refills | Status: DC | PRN

## 2018-03-01 MED ORDER — OSELTAMIVIR PHOSPHATE 75 MG PO CAPS: 75.0000 mg | ORAL_CAPSULE | Freq: Two times a day (BID) | ORAL | 0 refills | Status: DC

## 2018-03-01 MED ORDER — BALOXAVIR MARBOXIL(40 MG DOSE) 2 X 20 MG PO TBPK: 40.0000 mg | ORAL_TABLET | Freq: Once | ORAL | 0 refills | Status: DC

## 2018-03-01 MED ORDER — ONDANSETRON 4 MG PO TBDP: 4.0000 mg | ORAL_TABLET | Freq: Three times a day (TID) | ORAL | 0 refills | Status: DC | PRN

## 2018-03-01 NOTE — Telephone Encounter (Signed)
Pt said the xofluza is going to cost $158 with the coupon.  Any thing cheaper?  [phone]

## 2018-03-01 NOTE — Telephone Encounter (Signed)
Spoke with patient's husband. Sent new rx Tamiflu 75mg  BID x 5 days for influenza treatment instead, uncertain why Xofluza was not preferred/covered.  [name] Karamalegos, DO [facility] 03/01/2018, 12:02 PM

## 2018-03-01 NOTE — Patient Instructions (Addendum)
Thank you for coming to the office today.  1. You tested positive for Influenza A today (rapid flu swab test in office)  Start Xofluza 20mg  tablets x 2 for one dose then done. AFTER VISIT / PRINTED - Patient should now take Tamiflu - bc of cost of Xofluza  - Wash hands and cover cough very well to avoid spread of infection - For symptom control:      - Take Ibuprofen / Advil 400-600mg  every 6-8 hours as needed for fever / muscle aches, and may also take Tylenol 500-1000mg  per dose every 6-8 hours or 3 times a day, can alternate dosing      - Start Zofran oral dissolving tablet as needed 1-2 every 8 hours for nausea vomit      - Start Tessalon perls one every 8 hours or 3 times a day as needed for cough      - Start OTC Mucinex-DM for cough and congestion for up to 7 days - Improve hydration with plenty of clear fluids  If significant worsening with poor fluid intake, worsening fever, difficulty breathing due to coughing, worsening body aches, weakness, or other more concerning symptoms difficulty breathing you can seek treatment at Emergency Department. Also if improved flu symptoms and then worsening days to week later with concerns for bronchitis, productive cough fever chills again we may need to check for possible pneumonia that can occur after the flu  Please schedule a Follow-up Appointment to: Return in about 1 week (around 03/08/2018) for flu.  If you have any other questions or concerns, please feel free to call the office or send a message through MyChart. You may also schedule an earlier appointment if necessary.  Additionally, you may be receiving a survey about your experience at our office within a few days to 1 week by e-mail or mail. We value your feedback.  [name] Karamalegos, DO [facility]

## 2018-03-01 NOTE — Progress Notes (Addendum)
Subjective:    Patient ID: Brittany Mosley, female    DOB: 06/18/1985, 32 y.o.   MRN: [id number]  Brittany Mosley is a 32 y.o. female presenting on 03/01/2018 for Fever (chest congestion, nausea throwing up, bodyache onset 3 days)   HPI   INFLUENZA / NAUSEA VOMITING / FEVER Reports new onset acute symptoms for past 3 days with fever, congestion into chest with coughing, and bodyaches with chills. 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 Also today more new symptoms nausea vomiting. No known sick contact with flu - She had history of flu few years ago, feels similar, in past treated with Tamiflu - Denies any chest pain, dyspnea, rash, headache, sinus pain, dysuria  Health Maintenance: UTD Flu Vaccine 01/17/18 already this season  [facility] 2[facility] 03/01/2018 06/17/2017  Decreased Interest 0 0  Down, Depressed, Hopeless 0 0  PHQ - 2 Score 0 0    Social History   Tobacco Use  . Smoking status: Never Smoker  . Smokeless tobacco: Never Used  Substance Use Topics  . Alcohol use: No    Alcohol/week: 0.0 standard drinks  . Drug use: No    Review of Systems Per HPI unless specifically indicated above     Objective:    BP (!) 98/56   Pulse 97   Temp 99.1 F (37.3 C) (Oral)   Resp 16   Ht 5\' 2"  (1.575 m)   Wt 163 lb (73.9 kg)   SpO2 98%   BMI 29.81 kg/m   Wt Readings from Last 3 Encounters:  03/01/18 163 lb (73.9 kg)  06/17/17 166 lb 12.8 oz (75.7 kg)  06/06/17 164 lb 6.4 oz (74.6 kg)    Physical Exam  Constitutional: She is oriented to person, place, and time. She appears well-developed and well-nourished. No distress.  Mildly sick and tired appearing, resting on exam table, cooperative  HENT:  Head: Normocephalic and atraumatic.  Mouth/Throat: Oropharynx is clear and moist.  Frontal / maxillary sinuses non-tender. Mucus mem moist  Eyes: Conjunctivae are normal. Right eye exhibits no  discharge. Left eye exhibits no discharge.  Neck: Normal range of motion.  Cardiovascular: Regular rhythm, normal heart sounds and intact distal pulses.  No murmur heard. Mild tachycardia, with elevated temp  Pulmonary/Chest: Effort normal and breath sounds normal. No respiratory distress. She has no wheezes. She has no rales.  Frequent coughing. No focal abnormal breath sounds.  Musculoskeletal: Normal range of motion. She exhibits no edema.  Lymphadenopathy:    She has no cervical adenopathy.  Neurological: She is alert and oriented to person, place, and time.  Skin: Skin is warm and dry. No rash noted. She is not diaphoretic. No erythema.  Psychiatric: Her behavior is normal.  Nursing note and vitals reviewed.  Results for orders placed or performed in visit on 03/01/18  POCT Influenza A/B  Result Value Ref Range   Influenza A, POC Positive (A) Negative   Influenza B, POC Negative Negative      Assessment & Plan:   Problem List Items Addressed This Visit    None    Visit Diagnoses    Influenza A    -  Primary   Relevant Medications   benzonatate (TESSALON) 100 MG capsule   ondansetron (ZOFRAN ODT) 4 MG disintegrating tablet   oseltamivir (TAMIFLU) 75 MG capsule   Other Relevant Orders   POCT Influenza A/B (Completed)  Fever and chills       Non-intractable vomiting with nausea, unspecified vomiting type       Relevant Medications   ondansetron (ZOFRAN ODT) 4 MG disintegrating tablet      Clinically consistent with flu and confirmed Influenza A today on rapid flu test - note it is a weak positive at or after >15 min on test POC - No known exposure to flu documented - Duration x 3 days, without complication. Tolerating PO and well hydrated - No other focal findings of infection today - S/p influenza vaccine this season 01/17/18  Plan: 1. Start Xofluza 40mg  x 1 dose ( 2 of the 20mg  tab) today - gave manufacturer coupon for discount - **UPDATE - patient/s husband  called and said that cost was $150 with coupon, not covered - now I changed it to Tamiflu 75mg  cap BID x 5 days - sent back to [facility]**  2. Supportive care as advised with NSAID / Tylenol PRN fever/myalgias, improve hydration, may take OTC Cold/Flu meds 3. Rx Zofran ODT PRN nausea/vomiting - Start Tessalon Perls take 1 capsule up to 3 times a day as needed for cough 4. Return criteria given if significant worsening, consider post-influenza complications, otherwise follow-up if needed  Note for work - out 24-48 hours fever free  Meds ordered this encounter  Medications  . benzonatate (TESSALON) 100 MG capsule    Sig: Take 1 capsule (100 mg total) by mouth 3 (three) times daily as needed for cough.    Dispense:  30 capsule    Refill:  0  . DISCONTD: Baloxavir Marboxil,40 MG Dose, (XOFLUZA) 2 x 20 MG TBPK    Sig: Take 40 mg by mouth once for 1 dose. For Flu    Dispense:  1 each    Refill:  0  . ondansetron (ZOFRAN ODT) 4 MG disintegrating tablet    Sig: Take 1 tablet (4 mg total) by mouth every 8 (eight) hours as needed for nausea or vomiting.    Dispense:  30 tablet    Refill:  0  . oseltamivir (TAMIFLU) 75 MG capsule    Sig: Take 1 capsule (75 mg total) by mouth 2 (two) times daily. For 5 days    Dispense:  10 capsule    Refill:  0    Follow up plan: Return in about 1 week (around 03/08/2018) for flu.  Saralyn Pilar, DO [facility] 03/01/2018, 11:23 AM

## 2018-03-01 NOTE — Addendum Note (Signed)
Addended by: Smitty CordsKARAMALEGOS, ALEXANDER J on: 03/01/2018 11:24 AM   Modules accepted: Orders

## 2018-03-02 ENCOUNTER — Ambulatory Visit (INDEPENDENT_AMBULATORY_CARE_PROVIDER_SITE_OTHER): Payer: BC Managed Care – PPO

## 2018-03-02 ENCOUNTER — Telehealth: Payer: Self-pay | Admitting: Family Medicine

## 2018-03-02 ENCOUNTER — Other Ambulatory Visit: Payer: Self-pay

## 2018-03-02 ENCOUNTER — Ambulatory Visit
Admission: EM | Admit: 2018-03-02 | Discharge: 2018-03-02 | Disposition: A | Discharge status: Home / Self Care | Payer: BC Managed Care – PPO | Attending: Family Medicine | Admitting: Family Medicine

## 2018-03-02 ENCOUNTER — Encounter: Payer: Self-pay | Admitting: Emergency Medicine

## 2018-03-02 DIAGNOSIS — J189 Pneumonia, unspecified organism: Secondary | ICD-10-CM

## 2018-03-02 MED ORDER — AMOXICILLIN-POT CLAVULANATE ER 1000-62.5 MG PO TB12: 2.0000 | ORAL_TABLET | Freq: Two times a day (BID) | ORAL | 0 refills | Status: AC

## 2018-03-02 MED ORDER — HYDROCOD POLST-CPM POLST ER 10-8 MG/5ML PO SUER: 5.0000 mL | Freq: Two times a day (BID) | ORAL | 0 refills | Status: DC | PRN

## 2018-03-02 MED ORDER — AZITHROMYCIN 250 MG PO TABS: ORAL_TABLET | ORAL | 0 refills | Status: DC

## 2018-03-02 NOTE — Telephone Encounter (Signed)
The cough medicine is not working.  Please call [phone]

## 2018-03-02 NOTE — ED Provider Notes (Signed)
[facility]    CSN: [id number] Arrival date & time: 03/02/18  1025  History   Chief Complaint Chief Complaint  Patient presents with  . Shortness of Breath   HPI  32 year old female presents with the above complaint.  Patient has been sick since Monday.  Started with cough.  Worsened on Tuesday.  She saw her primary yesterday.  Presented with fever, coughing, body aches, and chills.  Tested positive for influenza A.  Was started on Tamiflu.  Tessalon Perles for cough.  Patient and husband states that she has been worsening. Now experiencing shortness of breath.  Particularly worse with exertion.  Was advised to come in for evaluation by PCP.  Still having fever, chills, body aches.  Symptoms are severe.  She is had no improvement thus far.  Cough is worse at night.  Interfering with sleep.  Decreased p.o. intake.  No other associated symptoms.  No other complaints.  PMH, Surgical Hx, Family Hx, Social History reviewed and updated as below.  Past Medical History:  Diagnosis Date  . Anemia   . Headache    Patient Active Problem List   Diagnosis Date Noted  . Anemia 04/26/2016  . Chronic migraine without aura, with status migrainosus 03/30/2016   Past Surgical History:  Procedure Laterality Date  . CESAREAN SECTION  2010-2012   OB History   None    Home Medications    Prior to Admission medications   Medication Sig Start Date End Date Taking? Authorizing Provider  benzonatate (TESSALON) 100 MG capsule Take 1 capsule (100 mg total) by mouth 3 (three) times daily as needed for cough. 03/01/18  Yes Karamalegos, Netta Neat, DO  fluticasone (FLONASE) 50 MCG/ACT nasal spray Place 2 sprays into both nostrils daily. 07/31/15  Yes Krebs, Amy Lauren, NP  levonorgestrel-ethinyl estradiol (SEASONALE,INTROVALE,JOLESSA) 0.15-0.03 MG tablet Take by mouth. 11/30/17  Yes [provider]  loratadine (CLARITIN) 10 MG tablet Take 1 tablet (10 mg total) by mouth daily.  07/31/15  Yes Krebs, Amy Lauren, NP  ondansetron (ZOFRAN ODT) 4 MG disintegrating tablet Take 1 tablet (4 mg total) by mouth every 8 (eight) hours as needed for nausea or vomiting. 03/01/18  Yes Karamalegos, Netta Neat, DO  oseltamivir (TAMIFLU) 75 MG capsule Take 1 capsule (75 mg total) by mouth 2 (two) times daily. For 5 days 03/01/18  Yes Karamalegos, Netta Neat, DO  SUMAtriptan (IMITREX) 50 MG tablet TAKE 1 TABLET BY MOUTH ONCE AS NEEDED FOR MIGRAINE. MAY REPEAT ONE DOSE IN 2 HOURS IF PERSISTS FOR A MAX DOSE OF 2 TABLETS PER 24 HOURS. 06/17/17  Yes Karamalegos, Alexander J, DO  amoxicillin-clavulanate (AUGMENTIN XR) 1000-62.5 MG 12 hr tablet Take 2 tablets by mouth 2 (two) times daily for 7 days. 03/02/18 03/09/18  Tommie Sams, DO  azithromycin (ZITHROMAX) 250 MG tablet 2 tablets on day 1, then 1 tablet daily on days 2-5. 03/02/18   Tommie Sams, DO  chlorpheniramine-HYDROcodone (TUSSIONEX PENNKINETIC ER) 10-8 MG/5ML SUER Take 5 mLs by mouth every 12 (twelve) hours as needed. 03/02/18   Tommie Sams, DO  etonogestrel-ethinyl estradiol (NUVARING) 0.12-0.015 MG/24HR vaginal ring Place 1 each vaginally every 28 (twenty-eight) days. Reported on 07/31/2015 07/25/15 08/18/15  [provider]   Family History Family History  Problem Relation Age of Onset  . Rheum arthritis Mother   . Hearing loss Mother 20       one ear  . Alzheimer's disease Maternal Grandmother   . Heart disease Maternal Grandfather  38   Social History Social History   Tobacco Use  . Smoking status: Never Smoker  . Smokeless tobacco: Never Used  Substance Use Topics  . Alcohol use: No    Alcohol/week: 0.0 standard drinks  . Drug use: No   Allergies   Pollen extract  Review of Systems Review of Systems  Constitutional: Positive for chills, fatigue and fever.  Respiratory: Positive for cough and shortness of breath.   Musculoskeletal:       Body aches.   Physical Exam Triage Vital Signs ED Triage Vitals    Enc Vitals Group     BP 03/02/18 1038 108/79     Pulse Rate 03/02/18 1038 (!) 140     Resp 03/02/18 1038 20     Temp 03/02/18 1038 100.1 F (37.8 C)     Temp Source 03/02/18 1038 Oral     SpO2 03/02/18 1038 97 %     Weight 03/02/18 1037 160 lb (72.6 kg)     Height 03/02/18 1037 5\' 2"  (1.575 m)     Head Circumference --      Peak Flow --      Pain Score 03/02/18 1036 2     Pain Loc --      Pain Edu? --      Excl. in GC? --    Updated Vital Signs BP 108/79 (BP Location: Right Arm)   Pulse (!) 140   Temp 100.1 F (37.8 C) (Oral)   Resp 20   Ht 5\' 2"  (1.575 m)   Wt 72.6 kg   SpO2 97%   BMI 29.26 kg/m   Visual Acuity Right Eye Distance:   Left Eye Distance:   Bilateral Distance:    Right Eye Near:   Left Eye Near:    Bilateral Near:     Physical Exam  Constitutional: She is oriented to person, place, and time. She appears well-developed. No distress.  Eyes: Conjunctivae are normal. Right eye exhibits no discharge. Left eye exhibits no discharge.  Cardiovascular:  Regular rhythm. Tachycardic.  Pulmonary/Chest:  Course breath sounds throughout.  Neurological: She is alert and oriented to person, place, and time.  Psychiatric: She has a normal mood and affect. Her behavior is normal.  Nursing note and vitals reviewed.  UC Treatments / Results  Labs (all labs ordered are listed, but only abnormal results are displayed) Labs Reviewed - No data to display  EKG None  Radiology Dg Chest 2 View  Result Date: 03/02/2018 CLINICAL DATA:  Pt states she had flu positive test yesterday, fever and anxious. became SOB over night worse this morning and lead to panic attack. Prod cough, hx chronic cough EXAM: CHEST - 2 VIEW COMPARISON:  07/31/2015 FINDINGS: Heart size is normal. There is patchy infiltrate in the RIGHT LOWER lobe and RIGHT middle lobe. No pulmonary edema or pleural effusions. Visualized osseous structures have a normal appearance. IMPRESSION: RIGHT middle lobe  and RIGHT LOWER lobe infiltrate. Electronically Signed   By: Norva Pavlov M.D.   On: 03/02/2018 11:04    Procedures Procedures (including critical care time)  Medications Ordered in UC Medications - No data to display  Initial Impression / Assessment and Plan / UC Course  I have reviewed the triage vital signs and the nursing notes.  Pertinent labs & imaging results that were available during my care of the patient were reviewed by me and considered in my medical decision making (see chart for details).    32 year old female presents  with right middle lobe and right lower lobe pneumonia.  Recent positive for influenza.  Patient is on influenza treatment.  It is unclear whether this is primary influenza pneumonia or superimposed bacterial pneumonia.  I am placing her on Augmentin and azithromycin.  Tussionex for cough.  If she fails to improve or worsens, she should go to the hospital.  Final Clinical Impressions(s) / UC Diagnoses   Final diagnoses:  Pneumonia of right lung due to infectious organism, unspecified part of lung     Discharge Instructions     Antibiotic as prescribed.  If worsen or no improvement, please be re-evaluated.  Take care  Dr. Adriana Simasook    ED Prescriptions    Medication Sig Dispense Auth. Provider   amoxicillin-clavulanate (AUGMENTIN XR) 1000-62.5 MG 12 hr tablet Take 2 tablets by mouth 2 (two) times daily for 7 days. 28 tablet Ladan Vanderzanden G, DO   azithromycin (ZITHROMAX) 250 MG tablet 2 tablets on day 1, then 1 tablet daily on days 2-5. 6 tablet Rayonna Heldman G, DO   chlorpheniramine-HYDROcodone (TUSSIONEX PENNKINETIC ER) 10-8 MG/5ML SUER Take 5 mLs by mouth every 12 (twelve) hours as needed. 115 mL Tommie Samsook, Donielle Kaigler G, DO     Controlled Substance Prescriptions [place] Controlled Substance Registry consulted? Not Applicable   Tommie SamsCook, Martavia Tye G, DO 03/02/18 1321

## 2018-03-02 NOTE — Discharge Instructions (Signed)
Antibiotic as prescribed.  If worsen or no improvement, please be re-evaluated.  Take care  Dr. Adriana Simasook

## 2018-03-02 NOTE — Telephone Encounter (Signed)
Spoke with patient's husband. He has concerns that her breathing is significantly worse from yesterday, they are worried about her coughing, medicine not working, and they are asking for chest x-ray or other cough medicine, and worried about possible pneumonia now. She was treated for Influenza A with a weak positive test, see note from yesterday, now 24 hours on Tamiflu her fever has improved, but now respiratory is main concern.  We are fully booked today in office and would take some time to get X-ray result back to them as outpatient, and I advised them that given the severity of her symptoms yesterday and with an acute respiratory concern, they should seek care more immediately instead at [facility] ED or if they feel like her breathing is more stable but just persistent coughing is the primary concern and she wants an X-ray to test for pneumonia ,they could go to [facility] for evaluation promptly including X-ray and treatment.  They understood plan. And agree to proceed immediate care today from one of those options.  [name] Emad Brechtel, DO [facility] 03/02/2018, 9:46 AM

## 2018-03-02 NOTE — ED Triage Notes (Signed)
Patient positive for flu yesterday, shortness of breath and coughing has worsened since yesterday. Tessalon medication not helping with coughing at night. Contacted PCP this morning and was advised to come in for chest xray.

## 2018-04-06 ENCOUNTER — Telehealth: Payer: Self-pay | Admitting: Family Medicine

## 2018-04-06 ENCOUNTER — Other Ambulatory Visit: Payer: Self-pay | Admitting: Family Medicine

## 2018-04-06 DIAGNOSIS — G43719 Chronic migraine without aura, intractable, without status migrainosus: Secondary | ICD-10-CM

## 2018-04-06 NOTE — Telephone Encounter (Signed)
Rx send

## 2018-04-06 NOTE — Telephone Encounter (Signed)
Pt needs a refill on sumatriptan sent to [facility] [place].  Husbands call back is [phone]

## 2018-05-22 ENCOUNTER — Ambulatory Visit: Payer: BC Managed Care – PPO | Admitting: Family Medicine

## 2018-05-22 ENCOUNTER — Encounter: Payer: Self-pay | Admitting: Family Medicine

## 2018-05-22 VITALS — BP 124/72 | HR 73 | Temp 98.3°F | Resp 16 | Ht 62.0 in | Wt 169.0 lb

## 2018-05-22 DIAGNOSIS — S46811A Strain of other muscles, fascia and tendons at shoulder and upper arm level, right arm, initial encounter: Secondary | ICD-10-CM | POA: Diagnosis not present

## 2018-05-22 DIAGNOSIS — S46911A Strain of unspecified muscle, fascia and tendon at shoulder and upper arm level, right arm, initial encounter: Secondary | ICD-10-CM

## 2018-05-22 MED ORDER — CYCLOBENZAPRINE HCL 10 MG PO TABS: 5.0000 mg | ORAL_TABLET | Freq: Two times a day (BID) | ORAL | 0 refills | Status: DC | PRN

## 2018-05-22 MED ORDER — NAPROXEN 500 MG PO TABS: 500.0000 mg | ORAL_TABLET | Freq: Two times a day (BID) | ORAL | 0 refills | Status: DC

## 2018-05-22 NOTE — Progress Notes (Signed)
Subjective:    Patient ID: Brittany Mosley, female    DOB: 06/20/1985, 33 y.o.   MRN: [id number]  Brittany Sayersmanda D Neisler is a 33 y.o. female presenting on 05/22/2018 for Shoulder Pain (onset month right shoulder worst than Left can't sleep )   HPI   RIGHT SHOULDER PAIN / TRAPEZIUS MUSCLE STRAIN Reports chronic history of muscle tension and stress in her shoulders for past few years, she has been following with a massage therapist about every 1-2 months with good results. - R Hand dominant Now she presents today with symptoms onset about 4 weeks ago, she woke up overnight with stiffness and pain in her R shoulder, she uses ice packs, heating pad, TENs unit. She had more severe symptoms at that time. She was able to get it "loosened" during day to function, and she has been to her massage therapist. Worse again overnight. Seems - Also associated with some neck tension and soreness and upper muscles trapezius and R mid thoracic back - Another episode on 1/22 woke up with significant pain, she called [facility] and they phoned in prednisone dosepak, with significant improvement in pain, and it caused some side effect sweating, she felt nearly 100% resolved and then she stopped med, and then her symptoms returned immediately after finished. - Taking Ibuprofen 1000mg  1-2 times, some relief - significant but does not resolve issue - Recently joined gym and has been doing mostly cardio, without injury - Denies any fall, injury, numbness, swelling, redness, bruising   [facility] 05/22/2018 03/01/2018 06/17/2017  Decreased Interest 0 0 0  Down, Depressed, Hopeless 0 0 0  PHQ - 2 Score 0 0 0    Social History   Tobacco Use  . Smoking status: Never Smoker  . Smokeless tobacco: Never Used  Substance Use Topics  . Alcohol use: No    Alcohol/week: 0.0 standard drinks  . Drug use: No    Review of Systems Per HPI unless specifically indicated above     Objective:    BP 124/72   Pulse  73   Temp 98.3 F (36.8 C) (Oral)   Resp 16   Ht 5\' 2"  (1.575 m)   Wt 169 lb (76.7 kg)   BMI 30.91 kg/m   Wt Readings from Last 3 Encounters:  05/22/18 169 lb (76.7 kg)  03/02/18 160 lb (72.6 kg)  03/01/18 163 lb (73.9 kg)    Physical Exam Vitals signs and nursing note reviewed.  Constitutional:      General: She is not in acute distress.    Appearance: She is well-developed. She is not diaphoretic.     Comments: Well-appearing, comfortable, cooperative  HENT:     Head: Normocephalic and atraumatic.  Eyes:     General:        Right eye: No discharge.        Left eye: No discharge.     Conjunctiva/sclera: Conjunctivae normal.  Neck:     Musculoskeletal: Neck supple. No muscular tenderness.     Comments: Neck Inspection: no paraspinal hypertonicity or asymmetry Palpation: non tender over cervical spinous process ROM: slightly reduced R range rotation, intact flex/ext L rotation Special Testing: negative Spurling's maneuver  Strength: distal intact Neurovascular: distal intact  Cardiovascular:     Rate and Rhythm: Normal rate.  Pulmonary:     Effort: Pulmonary effort is normal.  Musculoskeletal:     Comments: RIGHT Shoulder Inspection: Normal appearance bilateral symmetrical Palpation: Mild tender to palpation over posterior  shoulder and subscapular region, trapezius muscle ROM: Full intact active ROM forward flexion, abduction, internal / external rotation, symmetrical Special Testing: Rotator cuff testing negative for weakness with supraspinatus full can and empty can test, O'brien's negative for labral pain, Hawkin's AC impingement negative for pain Strength: Normal strength 5/5 flex/ext, ext rot / int rot, grip, rotator cuff str testing. Neurovascular: Distally intact pulses, sensation to light touch   Skin:    General: Skin is warm and dry.     Findings: No erythema or rash.  Neurological:     Mental Status: She is alert and oriented to person, place, and time.    Psychiatric:        Behavior: Behavior normal.     Comments: Well groomed, good eye contact, normal speech and thoughts        Assessment & Plan:   Problem List Items Addressed This Visit    None    Visit Diagnoses    Muscle strain of right shoulder, initial encounter    -  Primary   Relevant Medications   naproxen (NAPROSYN) 500 MG tablet   cyclobenzaprine (FLEXERIL) 10 MG tablet   Trapezius muscle strain, right, initial encounter       Relevant Medications   naproxen (NAPROSYN) 500 MG tablet   cyclobenzaprine (FLEXERIL) 10 MG tablet      Consistent with R > L trapezius muscle spasm with radiating neck to shoulder pain. No clear etiology for injury except possibly increased exercise recently. No neurological deficits or weakness. Improved on prednisone as reported temporarily Limited improve NSAID Ibuprofen  Plan: 1. Start anti-inflammatory Naprosyn 500mg  BID WC x 1-2 week, then PRN 2. Add Flexeril 5-10 mg BID PRN - caution sedation, may help sleep 3. Recommend regular use heating pad / moist heat, stretching, avoid heavy lifting / repetitive activities, Tylenol PRN - Handout given and demo today for localized scapular release technique she can work on at home - Continue massage therapy - No x-ray indicated Follow-up within 4 weeks if not improved or new concerns / symptoms   Meds ordered this encounter  Medications  . naproxen (NAPROSYN) 500 MG tablet    Sig: Take 1 tablet (500 mg total) by mouth 2 (two) times daily with a meal. For 1-2 weeks then as needed    Dispense:  60 tablet    Refill:  0  . cyclobenzaprine (FLEXERIL) 10 MG tablet    Sig: Take 0.5-1 tablets (5-10 mg total) by mouth 2 (two) times daily as needed for muscle spasms.    Dispense:  30 tablet    Refill:  0     Follow up plan: Return in about 4 weeks (around 06/19/2018) for shoulder pain / back muscle strain.   Saralyn Pilar, DO [facility] 05/22/2018, 1:59 PM

## 2018-05-22 NOTE — Patient Instructions (Addendum)
Thank you for coming to the office today.  Likely muscle strain - Trapezius, and under scapula on R shoulder blade, these can radiate down arm and into lower back.  May be worse with impact treadmill.  Recommend trial of Anti-inflammatory with Naproxen (Naprosyn) 500mg  tabs - take one with food and plenty of water TWICE daily every day (breakfast and dinner), for next 2 to 4 weeks, then you may take only as needed - DO NOT TAKE any ibuprofen, aleve, motrin while you are taking this medicine - It is safe to take Tylenol Ext Str 500mg  tabs - take 1 to 2 (max dose 1000mg ) every 6 hours as needed for breakthrough pain, max 24 hour daily dose is 6 to 8 tablets or 4000mg   Start Cyclobenzapine (Flexeril) 10mg  tablets (muscle relaxant) - start with half (cut) to one whole pill at night for muscle relaxant - may make you sedated or sleepy (be careful driving or working on this) if tolerated you can take half to whole tab 2 to 3 times daily or every 8 hours as needed   Please schedule a Follow-up Appointment to: Return in about 4 weeks (around 06/19/2018) for shoulder pain / back muscle strain.  If you have any other questions or concerns, please feel free to call the office or send a message through MyChart. You may also schedule an earlier appointment if necessary.  Additionally, you may be receiving a survey about your experience at our office within a few days to 1 week by e-mail or mail. We value your feedback.  Brittany Mosley Brittany Keesling, DO [facility], [facility]   Shoulder Exercises Ask your health care provider which exercises are safe for you. Do exercises exactly as told by your health care provider and adjust them as directed. It is normal to feel mild stretching, pulling, tightness, or discomfort as you do these exercises, but you should stop right away if you feel sudden pain or your pain gets worse.Do not begin these exercises until told by your health care provider. Range of  Motion Exercises        These exercises warm up your muscles and joints and improve the movement and flexibility of your shoulder. These exercises also help to relieve pain, numbness, and tingling. These exercises involve stretching your injured shoulder directly. Exercise A: Pendulum 1. Stand near a wall or a surface that you can hold onto for balance. 2. Bend at the waist and let your left / right arm hang straight down. Use your other arm to support you. Keep your back straight and do not lock your knees. 3. Relax your left / right arm and shoulder muscles, and move your hips and your trunk so your left / right arm swings freely. Your arm should swing because of the motion of your body, not because you are using your arm or shoulder muscles. 4. Keep moving your body so your arm swings in the following directions, as told by your health care provider: ? Side to side. ? Forward and backward. ? In clockwise and counterclockwise circles. 5. Continue each motion for __________ seconds, or for as long as told by your health care provider. 6. Slowly return to the starting position. Repeat __________ times. Complete this exercise __________ times a day. Exercise B:Flexion, Standing 1. Stand and hold a broomstick, a cane, or a similar object. Place your hands a little more than shoulder-width apart on the object. Your left / right hand should be palm-up, and your other hand  should be palm-down. 2. Keep your elbow straight and keep your shoulder muscles relaxed. Push the stick down with your healthy arm to raise your left / right arm in front of your body, and then over your head until you feel a stretch in your shoulder. ? Avoid shrugging your shoulder while you raise your arm. Keep your shoulder blade tucked down toward the middle of your back. 3. Hold for __________ seconds. 4. Slowly return to the starting position. Repeat __________ times. Complete this exercise __________ times a  day. Exercise C: Abduction, Standing 1. Stand and hold a broomstick, a cane, or a similar object. Place your hands a little more than shoulder-width apart on the object. Your left / right hand should be palm-up, and your other hand should be palm-down. 2. While keeping your elbow straight and your shoulder muscles relaxed, push the stick across your body toward your left / right side. Raise your left / right arm to the side of your body and then over your head until you feel a stretch in your shoulder. ? Do not raise your arm above shoulder height, unless your health care provider tells you to do that. ? Avoid shrugging your shoulder while you raise your arm. Keep your shoulder blade tucked down toward the middle of your back. 3. Hold for __________ seconds. 4. Slowly return to the starting position. Repeat __________ times. Complete this exercise __________ times a day. Exercise D:Internal Rotation 1. Place your left / right hand behind your back, palm-up. 2. Use your other hand to dangle an exercise band, a towel, or a similar object over your shoulder. Grasp the band with your left / right hand so you are holding onto both ends. 3. Gently pull up on the band until you feel a stretch in the front of your left / right shoulder. ? Avoid shrugging your shoulder while you raise your arm. Keep your shoulder blade tucked down toward the middle of your back. 4. Hold for __________ seconds. 5. Release the stretch by letting go of the band and lowering your hands. Repeat __________ times. Complete this exercise __________ times a day. Stretching Exercises  These exercises warm up your muscles and joints and improve the movement and flexibility of your shoulder. These exercises also help to relieve pain, numbness, and tingling. These exercises are done using your healthy shoulder to help stretch the muscles of your injured shoulder. Exercise E: Research officer, political party (External Rotation and Abduction) 1. Stand  in a doorway with one of your feet slightly in front of the other. This is called a staggered stance. If you cannot reach your forearms to the door frame, stand facing a corner of a room. 2. Choose one of the following positions as told by your health care provider: ? Place your hands and forearms on the door frame above your head. ? Place your hands and forearms on the door frame at the height of your head. ? Place your hands on the door frame at the height of your elbows. 3. Slowly move your weight onto your front foot until you feel a stretch across your chest and in the front of your shoulders. Keep your head and chest upright and keep your abdominal muscles tight. 4. Hold for __________ seconds. 5. To release the stretch, shift your weight to your back foot. Repeat __________ times. Complete this stretch __________ times a day. Exercise F:Extension, Standing 1. Stand and hold a broomstick, a cane, or a similar object behind your back. ?  Your hands should be a little wider than shoulder-width apart. ? Your palms should face away from your back. 2. Keeping your elbows straight and keeping your shoulder muscles relaxed, move the stick away from your body until you feel a stretch in your shoulder. ? Avoid shrugging your shoulders while you move the stick. Keep your shoulder blade tucked down toward the middle of your back. 3. Hold for __________ seconds. 4. Slowly return to the starting position. Repeat __________ times. Complete this exercise __________ times a day. Strengthening Exercises           These exercises build strength and endurance in your shoulder. Endurance is the ability to use your muscles for a long time, even after they get tired. Exercise G:External Rotation 1. Sit in a stable chair without armrests. 2. Secure an exercise band at elbow height on your left / right side. 3. Place a soft object, such as a folded towel or a small pillow, between your left / right  upper arm and your body to move your elbow a few inches away (about 10 cm) from your side. 4. Hold the end of the band so it is tight and there is no slack. 5. Keeping your elbow pressed against the soft object, move your left / right forearm out, away from your abdomen. Keep your body steady so only your forearm moves. 6. Hold for __________ seconds. 7. Slowly return to the starting position. Repeat __________ times. Complete this exercise __________ times a day. Exercise H:Shoulder Abduction 1. Sit in a stable chair without armrests, or stand. 2. Hold a __________ weight in your left / right hand, or hold an exercise band with both hands. 3. Start with your arms straight down and your left / right palm facing in, toward your body. 4. Slowly lift your left / right hand out to your side. Do not lift your hand above shoulder height unless your health care provider tells you that this is safe. ? Keep your arms straight. ? Avoid shrugging your shoulder while you do this movement. Keep your shoulder blade tucked down toward the middle of your back. 5. Hold for __________ seconds. 6. Slowly lower your arm, and return to the starting position. Repeat __________ times. Complete this exercise __________ times a day. Exercise I:Shoulder Extension 1. Sit in a stable chair without armrests, or stand. 2. Secure an exercise band to a stable object in front of you where it is at shoulder height. 3. Hold one end of the exercise band in each hand. Your palms should face each other. 4. Straighten your elbows and lift your hands up to shoulder height. 5. Step back, away from the secured end of the exercise band, until the band is tight and there is no slack. 6. Squeeze your shoulder blades together as you pull your hands down to the sides of your thighs. Stop when your hands are straight down by your sides. Do not let your hands go behind your body. 7. Hold for __________ seconds. 8. Slowly return to the  starting position. Repeat __________ times. Complete this exercise __________ times a day. Exercise J:Standing Shoulder Row 1. Sit in a stable chair without armrests, or stand. 2. Secure an exercise band to a stable object in front of you so it is at waist height. 3. Hold one end of the exercise band in each hand. Your palms should be in a thumbs-up position. 4. Bend each of your elbows to an "L" shape (about 90 degrees)  and keep your upper arms at your sides. 5. Step back until the band is tight and there is no slack. 6. Slowly pull your elbows back behind you. 7. Hold for __________ seconds. 8. Slowly return to the starting position. Repeat __________ times. Complete this exercise __________ times a day. Exercise K:Shoulder Press-Ups 1. Sit in a stable chair that has armrests. Sit upright, with your feet flat on the floor. 2. Put your hands on the armrests so your elbows are bent and your fingers are pointing forward. Your hands should be about even with the sides of your body. 3. Push down on the armrests and use your arms to lift yourself off of the chair. Straighten your elbows and lift yourself up as much as you comfortably can. ? Move your shoulder blades down, and avoid letting your shoulders move up toward your ears. ? Keep your feet on the ground. As you get stronger, your feet should support less of your body weight as you lift yourself up. 4. Hold for __________ seconds. 5. Slowly lower yourself back into the chair. Repeat __________ times. Complete this exercise __________ times a day. Exercise L: Wall Push-Ups 1. Stand so you are facing a stable wall. Your feet should be about one arm-length away from the wall. 2. Lean forward and place your palms on the wall at shoulder height. 3. Keep your feet flat on the floor as you bend your elbows and lean forward toward the wall. 4. Hold for __________ seconds. 5. Straighten your elbows to push yourself back to the starting  position. Repeat __________ times. Complete this exercise __________ times a day. This information is not intended to replace advice given to you by your health care provider. Make sure you discuss any questions you have with your health care provider. Document Released: 02/17/2005 Document Revised: 08/09/2017 Document Reviewed: 12/15/2014 Elsevier Interactive Patient Education  2019 [employer].

## 2018-12-01 ENCOUNTER — Other Ambulatory Visit: Payer: Self-pay

## 2018-12-01 ENCOUNTER — Ambulatory Visit (INDEPENDENT_AMBULATORY_CARE_PROVIDER_SITE_OTHER): Payer: BC Managed Care – PPO

## 2018-12-01 ENCOUNTER — Encounter: Payer: Self-pay | Admitting: Podiatry

## 2018-12-01 ENCOUNTER — Ambulatory Visit: Payer: BC Managed Care – PPO | Admitting: Podiatry

## 2018-12-01 ENCOUNTER — Other Ambulatory Visit: Payer: Self-pay | Admitting: Podiatry

## 2018-12-01 VITALS — Temp 98.3°F

## 2018-12-01 DIAGNOSIS — M722 Plantar fascial fibromatosis: Secondary | ICD-10-CM

## 2018-12-01 MED ORDER — METHYLPREDNISOLONE 4 MG PO TBPK: ORAL_TABLET | ORAL | 0 refills | Status: DC

## 2018-12-01 MED ORDER — MELOXICAM 15 MG PO TABS: 15.0000 mg | ORAL_TABLET | Freq: Every day | ORAL | 1 refills | Status: DC

## 2018-12-04 NOTE — Progress Notes (Signed)
   Subjective: 33 y.o. female presenting today for follow up evaluation of plantar fasciitis of the right foot. She states her right heel has improved. She now has a chief complaint of left lateral plantar heel pain that began 2-3 months ago. Exercising increases the pain. She states being on the foot all day causes the pain to be worse at night. She states she has "tried everything" for treatment with no significant relief. Patient is here for further evaluation and treatment.   Past Medical History:  Diagnosis Date  . Anemia   . Headache      Objective: Physical Exam General: The patient is alert and oriented x3 in no acute distress.  Dermatology: Skin is warm, dry and supple bilateral lower extremities. Negative for open lesions or macerations bilateral.   Vascular: Dorsalis Pedis and Posterior Tibial pulses palpable bilateral.  Capillary fill time is immediate to all digits.  Neurological: Epicritic and protective threshold intact bilateral.   Musculoskeletal: Tenderness to palpation to the plantar aspect of the left heel along the plantar fascia. All other joints range of motion within normal limits bilateral. Strength 5/5 in all groups bilateral.   Radiographic exam: Normal osseous mineralization. Joint spaces preserved. No fracture/dislocation/boney destruction. No other soft tissue abnormalities or radiopaque foreign bodies.   Assessment: 1. Plantar fasciitis left foot  Plan of Care:  1. Patient evaluated. Xrays reviewed.   2. Injection of 0.5cc Celestone soluspan injected into the left plantar fascia.  3. Rx for Medrol Dose Pak placed 4. Rx for Meloxicam ordered for patient. 5. Compression anklet dispensed.   6. Instructed patient regarding therapies and modalities at home to alleviate symptoms.  7. Appointment with Liliane Channel, Pedorthist, for custom molded orthotics.  8. Return to clinic in 4 weeks.    Automotive engineer.    Edrick Kins, DPM [facility]  Dr. Edrick Kins, DPM    [address], [place] [address]                Office [phone]  Fax [phone]

## 2018-12-20 ENCOUNTER — Ambulatory Visit (INDEPENDENT_AMBULATORY_CARE_PROVIDER_SITE_OTHER): Payer: BC Managed Care – PPO | Admitting: Orthotics

## 2018-12-20 ENCOUNTER — Other Ambulatory Visit: Payer: Self-pay

## 2018-12-20 DIAGNOSIS — M722 Plantar fascial fibromatosis: Secondary | ICD-10-CM | POA: Diagnosis not present

## 2018-12-21 NOTE — Progress Notes (Signed)

## 2018-12-29 ENCOUNTER — Other Ambulatory Visit: Payer: Self-pay

## 2018-12-29 ENCOUNTER — Ambulatory Visit: Payer: BC Managed Care – PPO | Admitting: Podiatry

## 2018-12-29 ENCOUNTER — Encounter: Payer: Self-pay | Admitting: Podiatry

## 2018-12-29 DIAGNOSIS — M722 Plantar fascial fibromatosis: Secondary | ICD-10-CM | POA: Diagnosis not present

## 2018-12-31 NOTE — Progress Notes (Signed)
   Subjective: 33 y.o. female presenting today for follow up evaluation of plantar fasciitis of the right foot. She states her right heel has improved. She now has a chief complaint of left lateral plantar heel pain that began 2-3 months ago. Exercising increases the pain. She states being on the foot all day causes the pain to be worse at night. She states she has "tried everything" for treatment with no significant relief. Patient is here for further evaluation and treatment.   Past Medical History:  Diagnosis Date  . Anemia   . Headache      Objective: Physical Exam General: The patient is alert and oriented x3 in no acute distress.  Dermatology: Skin is warm, dry and supple bilateral lower extremities. Negative for open lesions or macerations bilateral.   Vascular: Dorsalis Pedis and Posterior Tibial pulses palpable bilateral.  Capillary fill time is immediate to all digits.  Neurological: Epicritic and protective threshold intact bilateral.   Musculoskeletal: Tenderness to palpation to the plantar aspect of the left heel along the plantar fascia. All other joints range of motion within normal limits bilateral. Strength 5/5 in all groups bilateral.   Assessment: 1. Plantar fasciitis left foot - improved   Plan of Care:  1. Patient evaluated.    2. Injection of 0.5cc Celestone soluspan injected into the left plantar fascia.  3. Continue taking Meloxicam.  4. Patient gets custom orthotics at the end of the month.  5. Return to clinic in 6 weeks.   Elementary school teacher, 3rd grade.     Edrick Kins, DPM [facility]  Dr. Edrick Kins, DPM    [address], [place] [address]                Office [phone]  Fax [phone]

## 2019-01-24 ENCOUNTER — Other Ambulatory Visit: Payer: Self-pay

## 2019-01-24 ENCOUNTER — Other Ambulatory Visit: Payer: BC Managed Care – PPO | Admitting: Orthotics

## 2019-02-09 ENCOUNTER — Ambulatory Visit: Payer: BC Managed Care – PPO | Admitting: Podiatry

## 2019-02-09 ENCOUNTER — Encounter: Payer: Self-pay | Admitting: Podiatry

## 2019-02-09 ENCOUNTER — Other Ambulatory Visit: Payer: Self-pay

## 2019-02-09 DIAGNOSIS — M722 Plantar fascial fibromatosis: Secondary | ICD-10-CM

## 2019-02-09 NOTE — Patient Instructions (Signed)

## 2019-02-12 NOTE — Progress Notes (Signed)
   Subjective: 33 y.o. female presenting today for follow up evaluation of plantar fasciitis of the right foot. She reports continued daily pain. She states some days are worse than others. She states the last injection did not help provide any relief. She has been taking Meloxicam as directed. Being on the foot for long periods of time makes the pain worse. Patient is here for further evaluation and treatment.   Past Medical History:  Diagnosis Date  . Anemia   . Headache      Objective: Physical Exam General: The patient is alert and oriented x3 in no acute distress.  Dermatology: Skin is warm, dry and supple bilateral lower extremities. Negative for open lesions or macerations bilateral.   Vascular: Dorsalis Pedis and Posterior Tibial pulses palpable bilateral.  Capillary fill time is immediate to all digits.  Neurological: Epicritic and protective threshold intact bilateral.   Musculoskeletal: Tenderness to palpation to the plantar aspect of the left heel along the plantar fascia. All other joints range of motion within normal limits bilateral. Strength 5/5 in all groups bilateral.   Assessment: 1. Plantar fasciitis left foot  Plan of Care:  1. Patient evaluated.    2. Declined injection.  3. Prescription for Medrol Dose Pak provided to patient. 4. Prescription for Meloxicam provided to patient. 5. Custom orthotics dispensed today.  6. Return to clinic as needed. If not better, we will discuss surgery.    Elementary school teacher, 3rd grade.     Edrick Kins, DPM [facility]  Dr. Edrick Kins, DPM    [address], [place] [address]                Office [phone]  Fax [phone]

## 2019-02-16 ENCOUNTER — Encounter: Payer: Self-pay | Admitting: Podiatry

## 2019-02-19 MED ORDER — MELOXICAM 15 MG PO TABS: 15.0000 mg | ORAL_TABLET | Freq: Every day | ORAL | 3 refills | Status: DC

## 2019-02-19 MED ORDER — METHYLPREDNISOLONE 4 MG PO TBPK: ORAL_TABLET | ORAL | 0 refills | Status: DC

## 2019-06-16 ENCOUNTER — Ambulatory Visit: Payer: BC Managed Care – PPO | Attending: Internal Medicine

## 2019-06-16 DIAGNOSIS — Z23 Encounter for immunization: Secondary | ICD-10-CM

## 2019-06-16 NOTE — Progress Notes (Signed)
   Covid-19 Vaccination Clinic  Name:  Brittany Mosley    MRN: [id number] DOB: February 29, 1986  06/16/2019  Brittany Mosley was observed post Covid-19 immunization for 15 minutes without incidence. She was provided with Vaccine Information Sheet and instruction to access the V-Safe system.   Brittany Mosley was instructed to call 911 with any severe reactions post vaccine: Marland Kitchen Difficulty breathing  . Swelling of your face and throat  . A fast heartbeat  . A bad rash all over your body  . Dizziness and weakness    Immunizations Administered    Name Date Dose VIS Date Route   Moderna COVID-19 Vaccine 06/16/2019 10:16 AM 0.5 mL 03/20/2019 Intramuscular   Manufacturer: Moderna   Lot: [id number]   NDC: 96283-662-94

## 2019-07-14 ENCOUNTER — Ambulatory Visit: Payer: BC Managed Care – PPO | Attending: Internal Medicine

## 2019-07-14 ENCOUNTER — Ambulatory Visit: Payer: BC Managed Care – PPO

## 2019-07-14 DIAGNOSIS — Z23 Encounter for immunization: Secondary | ICD-10-CM

## 2019-07-14 NOTE — Progress Notes (Signed)
   Covid-19 Vaccination Clinic  Name:  Brittany Mosley    MRN: [id number] DOB: January 19, 1986  07/14/2019  [name] was observed post Covid-19 immunization for 15 minutes without incident. She was provided with Vaccine Information Sheet and instruction to access the V-Safe system.   [name] was instructed to call 911 with any severe reactions post vaccine: Marland Kitchen Difficulty breathing  . Swelling of face and throat  . A fast heartbeat  . A bad rash all over body  . Dizziness and weakness   Immunizations Administered    Name Date Dose VIS Date Route   Moderna COVID-19 Vaccine 07/14/2019  2:11 PM 0.5 mL 03/20/2019 Intramuscular   Manufacturer: Gala Murdoch   Lot: [id number]   NDC: 19166-060-04

## 2019-07-18 ENCOUNTER — Ambulatory Visit: Payer: BC Managed Care – PPO

## 2019-09-26 ENCOUNTER — Ambulatory Visit (INDEPENDENT_AMBULATORY_CARE_PROVIDER_SITE_OTHER): Payer: BC Managed Care – PPO | Admitting: Family Medicine

## 2019-09-26 ENCOUNTER — Other Ambulatory Visit: Payer: Self-pay

## 2019-09-26 ENCOUNTER — Encounter: Payer: Self-pay | Admitting: Family Medicine

## 2019-09-26 VITALS — BP 106/65 | HR 60 | Temp 97.8°F | Resp 16 | Ht 62.0 in | Wt 183.6 lb

## 2019-09-26 DIAGNOSIS — R635 Abnormal weight gain: Secondary | ICD-10-CM | POA: Diagnosis not present

## 2019-09-26 DIAGNOSIS — Z0000 Encounter for general adult medical examination without abnormal findings: Secondary | ICD-10-CM | POA: Diagnosis not present

## 2019-09-26 DIAGNOSIS — G43701 Chronic migraine without aura, not intractable, with status migrainosus: Secondary | ICD-10-CM

## 2019-09-26 DIAGNOSIS — R7309 Other abnormal glucose: Secondary | ICD-10-CM | POA: Diagnosis not present

## 2019-09-26 DIAGNOSIS — E669 Obesity, unspecified: Secondary | ICD-10-CM

## 2019-09-26 MED ORDER — SUMATRIPTAN SUCCINATE 50 MG PO TABS: ORAL_TABLET | ORAL | 5 refills | Status: DC

## 2019-09-26 NOTE — Progress Notes (Signed)
Subjective:    Patient ID: Brittany Mosley, female    DOB: August 15, 1985, 34 y.o.   MRN: [id number]  Brittany Mosley is a 34 y.o. female presenting on 09/26/2019 for Annual Exam   HPI   Annual Physical and due for fasting lab only today  Wellness Obesity BMI >33 reduced activity, some weight gain during past 1+ year with COVID concerns She has been working from home significantly improved stress. Not being in the classroom She has resumed some hiking and exercise now Admits some poor sleep at times.  Follow-up - Chronic Migraine Headaches: - Last visit with me 2019, for same problem, treated with sumatriptan oral PRN abortive, see prior notes for background information. - Interval update with dramatic improvement from working at home has had very few migraine headaches, only mild headaches at times. Since returning to school she has had some break through headaches at times. - she believes that stress is the primary trigger for migraine - Today patient reports doing very well with migraines, would like refill on Sumatriptan - Previous history - Reviews course of her migraines, has a cluster of migraines that occur over 2-3 days usually, then will have 1 or more weeks without headache. Describes predictable onset usually trigger is weather related. Now she is thinking stress related is major factor for her - NSAIDs PRN initially, then resolves with Sumatriptan will have side effect flushing 1 hour after med - Failed Topamax, Onzetra - No longer interested in other prophylaxis therapy -Denies fevers/chills, sweats, nausea, vomiting, abdominal pain, dyspnea, active headache, vision or hearing changes, active numbness tingling or weakness  Health Maintenance: Due for pap smear cervical cancer screen, last done [facility] 2017, see record below, now due at 4 years, she will call to schedule.  Depression screen [facility] 2/9 09/26/2019 05/22/2018 03/01/2018  Decreased Interest 0 0 0  Down, Depressed,  Hopeless 0 0 0  PHQ - 2 Score 0 0 0    Past Medical History:  Diagnosis Date   Anemia    Headache    Past Surgical History:  Procedure Laterality Date   CESAREAN SECTION  2010-2012   Social History   Socioeconomic History   Marital status: Single    Spouse name: Not on file   Number of children: Not on file   Years of education: Not on file   Highest education level: Not on file  Occupational History   Occupation: Teacher  Tobacco Use   Smoking status: Never Smoker   Smokeless tobacco: Never Used  Substance and Sexual Activity   Alcohol use: No    Alcohol/week: 0.0 standard drinks   Drug use: No   Sexual activity: Not on file  Other Topics Concern   Not on file  Social History Narrative   Not on file   Social Determinants of Health   Financial Resource Strain:    Difficulty of Paying Living Expenses:   Food Insecurity:    Worried About Programme researcher, broadcasting/film/video in the Last Year:    Barista in the Last Year:   Transportation Needs:    Freight forwarder (Medical):    Lack of Transportation (Non-Medical):   Physical Activity:    Days of Exercise per Week:    Minutes of Exercise per Session:   Stress:    Feeling of Stress :   Social Connections:    Frequency of Communication with Friends and Family:    Frequency of Social Gatherings with Friends  and Family:    Attends Religious Services:    Active Member of Clubs or Organizations:    Attends Music therapist:    Marital Status:   Intimate Partner Violence:    Fear of Current or Ex-Partner:    Emotionally Abused:    Physically Abused:    Sexually Abused:    Family History  Problem Relation Age of Onset   Rheum arthritis Mother    Hearing loss Mother 57       one ear   Alzheimer's disease Maternal Grandmother    Heart disease Maternal Grandfather 37   Current Outpatient Medications on File Prior to Visit  Medication Sig   fluticasone  (FLONASE) 50 MCG/ACT nasal spray Place 2 sprays into both nostrils daily. (Patient taking differently: Place 2 sprays into both nostrils as needed. )   loratadine (CLARITIN) 10 MG tablet Take 1 tablet (10 mg total) by mouth daily. (Patient taking differently: Take 10 mg by mouth as needed. )   levonorgestrel-ethinyl estradiol (SEASONALE,INTROVALE,JOLESSA) 0.15-0.03 MG tablet Take by mouth.   No current facility-administered medications on file prior to visit.    Review of Systems  Constitutional: Negative for activity change, appetite change, chills, diaphoresis, fatigue and fever.  HENT: Negative for congestion and hearing loss.   Eyes: Negative for visual disturbance.  Respiratory: Negative for apnea, cough, chest tightness, shortness of breath and wheezing.   Cardiovascular: Negative for chest pain, palpitations and leg swelling.  Gastrointestinal: Negative for abdominal pain, anal bleeding, blood in stool, constipation, diarrhea, nausea and vomiting.  Endocrine: Negative for cold intolerance.  Genitourinary: Negative for difficulty urinating, dysuria, frequency and hematuria.  Musculoskeletal: Negative for arthralgias, back pain and neck pain.  Skin: Negative for rash.  Allergic/Immunologic: Negative for environmental allergies.  Neurological: Negative for dizziness, weakness, light-headedness, numbness and headaches.  Hematological: Negative for adenopathy.  Psychiatric/Behavioral: Negative for behavioral problems, dysphoric mood and sleep disturbance. The patient is not nervous/anxious.    Per HPI unless specifically indicated above      Objective:    BP 106/65    Pulse 60    Temp 97.8 F (36.6 C) (Temporal)    Resp 16    Ht 5\' 2"  (1.575 m)    Wt 183 lb 9.6 oz (83.3 kg)    SpO2 100%    BMI 33.58 kg/m   Wt Readings from Last 3 Encounters:  09/26/19 183 lb 9.6 oz (83.3 kg)  05/22/18 169 lb (76.7 kg)  03/02/18 160 lb (72.6 kg)    Physical Exam Vitals and nursing note  reviewed.  Constitutional:      General: She is not in acute distress.    Appearance: She is well-developed. She is obese. She is not diaphoretic.     Comments: Well-appearing, comfortable, cooperative  HENT:     Head: Normocephalic and atraumatic.     Right Ear: Tympanic membrane and ear canal normal.     Left Ear: Tympanic membrane and ear canal normal.     Ears:     Comments: Cerumen bilateral Eyes:     General:        Right eye: No discharge.        Left eye: No discharge.     Conjunctiva/sclera: Conjunctivae normal.     Pupils: Pupils are equal, round, and reactive to light.  Neck:     Thyroid: No thyromegaly.  Cardiovascular:     Rate and Rhythm: Normal rate and regular rhythm.     Heart  sounds: Normal heart sounds. No murmur.  Pulmonary:     Effort: Pulmonary effort is normal. No respiratory distress.     Breath sounds: Normal breath sounds. No wheezing or rales.  Abdominal:     General: Bowel sounds are normal. There is no distension.     Palpations: Abdomen is soft. There is no mass.     Tenderness: There is no abdominal tenderness.  Musculoskeletal:        General: No tenderness. Normal range of motion.     Cervical back: Normal range of motion and neck supple.     Comments: Upper / Lower Extremities: - Normal muscle tone, strength bilateral upper extremities 5/5, lower extremities 5/5  Lymphadenopathy:     Cervical: No cervical adenopathy.  Skin:    General: Skin is warm and dry.     Findings: No erythema or rash.  Neurological:     Mental Status: She is alert and oriented to person, place, and time.     Comments: Distal sensation intact to light touch all extremities  Psychiatric:        Behavior: Behavior normal.     Comments: Well groomed, good eye contact, normal speech and thoughts         Lab Results - in this encounter  Pap IG, HPV-hr - [facility] (05/27/2015 4:23 PM) Pap IG, HPV-hr - [facility] (05/27/2015 4:23 PM)  Component Value Ref Range    DIAGNOSIS: - [facility] CommentComment: NEGATIVE FOR INTRAEPITHELIAL LESION AND MALIGNANCY.   Specimen adequacy: - [facility] Comment Comment:  Satisfactory for evaluation. Endocervical and/or squamous metaplastic cells (endocervical component) are present.   Clinician provided ICD10: - [facility] CommentComment: Z12.4   PERFORMED BY: - [facility] CommentComment: Sandre Kitty, Cytotechnologist (ASCP)   . - [facility] .   Note: - [facility] Comment Comment:  The Pap smear is a screening test designed to aid in the detection of premalignant and malignant conditions of the uterine cervix.It is not a diagnostic procedure and should not be used as the sole means of detecting cervical cancer.Both false-positive and false-negative reports do occur.   Test Methodology: - [facility] Comment Comment:  This liquid based ThinPrep(R) pap test was screened with the use of an image guided system.   HPV, high-risk - [facility] Negative Comment:  This high-risk HPV test detects thirteen high-risk types (16/18/31/33/35/39/45/51/52/56/58/59/68) without differentiation. Negative   Pap IG, HPV-hr - [facility] (05/27/2015 4:23 PM)  Specimen Performing Laboratory  Genital - Cervical/Endocervical [facility]    Pap IG, HPV-hr - [facility] (05/27/2015 4:23 PM)  Narrative  Performed at:01 - [facility]  [address], [place], [id number]  Lab Director: Mila Homer MD, Phone:[phone]  Performed at:02 [facility]  [address] [place], [id number]  Lab Director: Mila Homer MD, Phone:[phone]  Specimen Comment: Source............Marland KitchenCervix  Specimen Comment: No. of containers.Marland Kitchen01 [facility] Thin Prep Vial        Assessment & Plan:   Problem List Items Addressed This Visit    Chronic migraine without aura, with status migrainosus    Currently stable chronic migraines w/o aura - dramatic improvement work at home, less stress Controlled on abortive  PRN sumatriptan / ibuprofen only Triggers: weather/pressure/rain, stress, alcohol Meds - Failed Topamax (prophylaxis, 25 BID - ineffective), Failed Sumatriptan Nasal Spray/Onzetra (side effect, ineffective)  Plan Reassurance since it seems reduced frequency with less stress and working from home.  Refilled Sumatriptan 50mg  take 1 PRN - may repeat 2 hr, #12 pills +5 refills up to 6 mo  supply, maybe longer if using less often, notify office for refills when ready - may not need to take dose up to 100mg  if not needed - May still use Ibuprofen 600-800mg  PRN mild or initial HA - May monitor HA diary if helping keep track - Follow-up yearly for review of migraines, sooner as needed if breakthrough - can review other prophylaxis meds if interested again in future  Discussed options Nurtec ODT abortive therapy PRN in future instead of Sumatriptan if need and consider CGRP injectable prophylaxis Emgality etc given info      Relevant Medications   SUMAtriptan (IMITREX) 50 MG tablet   Other Relevant Orders   CBC with Differential/Platelet   COMPLETE METABOLIC PANEL WITH GFR    Other Visit Diagnoses    Annual physical exam    -  Primary   Relevant Orders   Hemoglobin A1c   CBC with Differential/Platelet   COMPLETE METABOLIC PANEL WITH GFR   Lipid panel   TSH   Abnormal weight gain       Relevant Orders   COMPLETE METABOLIC PANEL WITH GFR   Lipid panel   TSH   Abnormal glucose       Obesity (BMI 30.0-34.9)          Updated Health Maintenance information - Last pap smear updated double negative 05/2015, [facility], likely return by 3 years, now it is 4 years she can call and schedule f/u with GYN for routine pap + HPV screen - Offered Hep C screening, declined today, review guidelines on screening Labs ordered, f/u results Encouraged improvement to lifestyle with diet and exercise - Goal of weight loss   Meds ordered this encounter  Medications   SUMAtriptan (IMITREX) 50 MG tablet     Sig: TAKE 1 TABLET BY MOUTH 1 TIME AS NEEDED FOR MIGRAINE. MAY REPEAT 1 DOSE IN 2 HOURS IF PERSISTS FOR A. MAX DOSE OF 2 TABLETS PER 24 HOURS    Dispense:  12 tablet    Refill:  5     Follow up plan: Return in about 1 year (around 09/25/2020) for Annual Physical.   11/25/2020, DO [facility] 09/26/2019, 9:51 AM

## 2019-09-26 NOTE — Patient Instructions (Addendum)
Thank you for coming to the office today.  Nurtec ODT 75mg  newer migraine abortive medicine, one pill can last up to 48 hours. - Look into this can check cost / coverage, and let me know.  Consider Emgality, Ajovy, Aimovig - once a month injectable. Look into these options for future.  Refilled Sumatriptan  DUE for FASTING BLOOD WORK (no food or drink after midnight before the lab appointment, only water or coffee without cream/sugar on the morning of)  SCHEDULE "Lab Only" visit in the morning at the clinic for lab draw in 1 YEAR  - Make sure Lab Only appointment is at about 1 week before your next appointment, so that results will be available  For Lab Results, once available within 2-3 days of blood draw, you can can log in to MyChart online to view your results and a brief explanation. Also, we can discuss results at next follow-up visit.   Please schedule a Follow-up Appointment to: Return in about 1 year (around 09/25/2020) for Annual Physical.  If you have any other questions or concerns, please feel free to call the office or send a message through MyChart. You may also schedule an earlier appointment if necessary.  Additionally, you may be receiving a survey about your experience at our office within a few days to 1 week by e-mail or mail. We value your feedback.  11/25/2020, DO [facility], [facility]

## 2019-09-26 NOTE — Assessment & Plan Note (Signed)
Currently stable chronic migraines w/o aura - dramatic improvement work at home, less stress Controlled on abortive PRN sumatriptan / ibuprofen only Triggers: weather/pressure/rain, stress, alcohol Meds - Failed Topamax (prophylaxis, 25 BID - ineffective), Failed Sumatriptan Nasal Spray/Onzetra (side effect, ineffective)  Plan Reassurance since it seems reduced frequency with less stress and working from home.  Refilled Sumatriptan 50mg  take 1 PRN - may repeat 2 hr, #12 pills +5 refills up to 6 mo supply, maybe longer if using less often, notify office for refills when ready - may not need to take dose up to 100mg  if not needed - May still use Ibuprofen 600-800mg  PRN mild or initial HA - May monitor HA diary if helping keep track - Follow-up yearly for review of migraines, sooner as needed if breakthrough - can review other prophylaxis meds if interested again in future  Discussed options Nurtec ODT abortive therapy PRN in future instead of Sumatriptan if need and consider CGRP injectable prophylaxis Emgality etc given info

## 2019-09-28 LAB — COMPLETE METABOLIC PANEL WITH GFR
AG Ratio: 1.6 (calc) (ref 1.0–2.5)
ALT: 17 U/L (ref 6–29)
AST: 17 U/L (ref 10–30)
Albumin: 4.5 g/dL (ref 3.6–5.1)
Alkaline phosphatase (APISO): 83 U/L (ref 31–125)
BUN: 10 mg/dL (ref 7–25)
CO2: 29 mmol/L (ref 20–32)
Calcium: 10.2 mg/dL (ref 8.6–10.2)
Chloride: 101 mmol/L (ref 98–110)
Creat: 0.75 mg/dL (ref 0.50–1.10)
GFR, Est African American: 120 mL/min/{1.73_m2} (ref >=60)
GFR, Est Non African American: 103 mL/min/{1.73_m2} (ref >=60)
Globulin: 2.8 g/dL (calc) (ref 1.9–3.7)
Glucose, Bld: 89 mg/dL (ref 65–99)
Potassium: 4.3 mmol/L (ref 3.5–5.3)
Sodium: 139 mmol/L (ref 135–146)
Total Bilirubin: 0.5 mg/dL (ref 0.2–1.2)
Total Protein: 7.3 g/dL (ref 6.1–8.1)

## 2019-09-28 LAB — CBC WITH DIFFERENTIAL/PLATELET
Absolute Monocytes: 657 cells/uL (ref 200–950)
Basophils Absolute: 36 cells/uL (ref 0–200)
Basophils Relative: 0.4 %
Eosinophils Absolute: 108 cells/uL (ref 15–500)
Eosinophils Relative: 1.2 %
HCT: 45.1 % — ABNORMAL HIGH (ref 35.0–45.0)
Hemoglobin: 15 g/dL (ref 11.7–15.5)
Lymphs Abs: 2826 cells/uL (ref 850–3900)
MCH: 29.3 pg (ref 27.0–33.0)
MCHC: 33.3 g/dL (ref 32.0–36.0)
MCV: 88.1 fL (ref 80.0–100.0)
MPV: 10.4 fL (ref 7.5–12.5)
Monocytes Relative: 7.3 %
Neutro Abs: 5373 cells/uL (ref 1500–7800)
Neutrophils Relative %: 59.7 %
Platelets: 298 10*3/uL (ref 140–400)
RBC: 5.12 10*6/uL — ABNORMAL HIGH (ref 3.80–5.10)
RDW: 13.1 % (ref 11.0–15.0)
Total Lymphocyte: 31.4 %
WBC: 9 10*3/uL (ref 3.8–10.8)

## 2019-09-28 LAB — LIPID PANEL
Cholesterol: 203 mg/dL — ABNORMAL HIGH (ref <200)
HDL: 60 mg/dL (ref >=50)
LDL Cholesterol (Calc): 120 mg/dL (calc) — ABNORMAL HIGH
Non-HDL Cholesterol (Calc): 143 mg/dL (calc) — ABNORMAL HIGH (ref <130)
Total CHOL/HDL Ratio: 3.4 (calc) (ref <5.0)
Triglycerides: 124 mg/dL (ref <150)

## 2019-09-28 LAB — TSH: TSH: 2.6 mIU/L

## 2020-04-02 ENCOUNTER — Ambulatory Visit (INDEPENDENT_AMBULATORY_CARE_PROVIDER_SITE_OTHER): Payer: BC Managed Care – PPO

## 2020-04-02 ENCOUNTER — Ambulatory Visit
Admission: RE | Admit: 2020-04-02 | Discharge: 2020-04-02 | Disposition: A | Discharge status: Home / Self Care | Payer: BC Managed Care – PPO | Source: Ambulatory Visit | Attending: Physician Assistant | Admitting: Physician Assistant

## 2020-04-02 ENCOUNTER — Other Ambulatory Visit: Payer: Self-pay

## 2020-04-02 VITALS — BP 130/88 | HR 78 | Temp 98.4°F | Resp 18 | Ht 62.0 in | Wt 190.3 lb

## 2020-04-02 DIAGNOSIS — M25521 Pain in right elbow: Secondary | ICD-10-CM

## 2020-04-02 DIAGNOSIS — M549 Dorsalgia, unspecified: Secondary | ICD-10-CM

## 2020-04-02 MED ORDER — CYCLOBENZAPRINE HCL 10 MG PO TABS: 10.0000 mg | ORAL_TABLET | Freq: Three times a day (TID) | ORAL | 0 refills | Status: AC | PRN

## 2020-04-02 MED ORDER — NAPROXEN 500 MG PO TABS: 500.0000 mg | ORAL_TABLET | Freq: Two times a day (BID) | ORAL | 0 refills | Status: AC
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
## 2020-04-02 NOTE — Discharge Instructions (Signed)
X-ray of elbow is normal. Ice this area and take prescribed Naproxen for pain. Can also take Tylenol  BACK PAIN: Stressed avoiding painful activities . RICE (REST, ICE, COMPRESSION, ELEVATION) guidelines reviewed. May alternate ice and heat. Consider use of muscle rubs, Salonpas patches, etc. Use medications as directed including muscle relaxers if prescribed. Take anti-inflammatory medications as prescribed or OTC NSAIDs/Tylenol.  F/u with PCP in 7-10 days for reexamination, and please feel free to call or return to the urgent care at any time for any questions or concerns you may have and we will be happy to help you!   BACK PAIN RED FLAGS: If the back pain acutely worsens or there are any red flag symptoms such as numbness/tingling, leg weakness, saddle anesthesia, or loss of bowel/bladder control, go immediately to the ER. Follow up with [place] as scheduled or sooner if the pain does not begin to resolve or if it worsens before the follow up
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
## 2020-04-27 ENCOUNTER — Other Ambulatory Visit: Payer: Self-pay

## 2020-04-27 DIAGNOSIS — Z20822 Contact with and (suspected) exposure to covid-19: Secondary | ICD-10-CM

## 2020-04-29 LAB — NOVEL CORONAVIRUS, NAA: SARS-CoV-2, NAA: NOT DETECTED

## 2020-04-29 LAB — SARS-COV-2, NAA 2 DAY TAT

## 2020-05-03 ENCOUNTER — Other Ambulatory Visit: Payer: Self-pay

## 2020-05-03 DIAGNOSIS — Z20822 Contact with and (suspected) exposure to covid-19: Secondary | ICD-10-CM

## 2020-05-06 LAB — NOVEL CORONAVIRUS, NAA: SARS-CoV-2, NAA: NOT DETECTED

## 2020-12-10 ENCOUNTER — Encounter: Payer: Self-pay | Admitting: Family Medicine

## 2020-12-10 ENCOUNTER — Telehealth: Payer: Self-pay | Admitting: Physician Assistant

## 2020-12-10 DIAGNOSIS — H1031 Unspecified acute conjunctivitis, right eye: Secondary | ICD-10-CM

## 2020-12-10 MED ORDER — POLYMYXIN B-TRIMETHOPRIM 10000-0.1 UNIT/ML-% OP SOLN: OPHTHALMIC | 0 refills | Status: DC

## 2020-12-10 NOTE — Progress Notes (Signed)
Virtual Visit Consent   Brittany Mosley, you are scheduled for a virtual visit with a [facility] provider today.     Just as with appointments in the office, your consent must be obtained to participate.  Your consent will be active for this visit and any virtual visit you may have with one of our providers in the next 365 days.     If you have a MyChart account, a copy of this consent can be sent to you electronically.  All virtual visits are billed to your insurance company just like a traditional visit in the office.    As this is a virtual visit, video technology does not allow for your provider to perform a traditional examination.  This may limit your provider's ability to fully assess your condition.  If your provider identifies any concerns that need to be evaluated in person or the need to arrange testing (such as labs, EKG, etc.), we will make arrangements to do so.     Although advances in technology are sophisticated, we cannot ensure that it will always work on either your end or our end.  If the connection with a video visit is poor, the visit may have to be switched to a telephone visit.  With either a video or telephone visit, we are not always able to ensure that we have a secure connection.     I need to obtain your verbal consent now.   Are you willing to proceed with your visit today?    Brittany Mosley has provided verbal consent on 12/10/2020 for a virtual visit (video or telephone).   Brittany Mosley, [place]   Date: 12/10/2020 8:26 AM   Virtual Visit via Video Note   I, Brittany Mosley, connected with  Brittany Mosley  ([id number], 11-Jun-1985) on 12/10/20 at  8:30 AM EDT by a video-enabled telemedicine application and verified that I am speaking with the correct person using two identifiers.  Location: Patient: Virtual Visit Location Patient: Home Provider: Virtual Visit Location Provider: Home Office   I discussed the limitations of evaluation and management  by telemedicine and the availability of in person appointments. The patient expressed understanding and agreed to proceed.    History of Present Illness: Brittany Mosley is a 35 y.o. who identifies as a female who was assigned female at birth, and is being seen today for possible pink eye. Notes symptoms starting yesterday morning on waking with R eye redness and crusting. Throughout the day redness continued with itching and irritation, no overt pain. Slight drainage throughout the day. This morning with worsening crusting. Denies vision changes. Denies fever, chills, aches. Denies recent travel. Works as a Runner, broadcasting/film/video.   HPI: HPI  Problems:  Patient Active Problem List   Diagnosis Date Noted   Anemia 04/26/2016   Chronic migraine without aura, with status migrainosus 03/30/2016    Allergies:  Allergies  Allergen Reactions   Pollen Extract Itching   Medications:  Current Outpatient Medications:    trimethoprim-polymyxin b (POLYTRIM) ophthalmic solution, Apply 1-2 drops into affected eye QID x 5 days., Disp: 10 mL, Rfl: 0   fluticasone (FLONASE) 50 MCG/ACT nasal spray, Place 2 sprays into both nostrils daily. (Patient taking differently: Place 2 sprays into both nostrils as needed.), Disp: 16 g, Rfl: 11   loratadine (CLARITIN) 10 MG tablet, Take 1 tablet (10 mg total) by mouth daily. (Patient taking differently: Take 10 mg by mouth as needed.), Disp: 30 tablet, Rfl: 11  SUMAtriptan (IMITREX) 50 MG tablet, TAKE 1 TABLET BY MOUTH 1 TIME AS NEEDED FOR MIGRAINE. MAY REPEAT 1 DOSE IN 2 HOURS IF PERSISTS FOR A. MAX DOSE OF 2 TABLETS PER 24 HOURS, Disp: 12 tablet, Rfl: 5  Observations/Objective: Patient is well-developed, well-nourished in no acute distress.  Resting comfortably at home.  Head is normocephalic, atraumatic.  No labored breathing. Speech is clear and coherent with logical content.  Patient is alert and oriented at baseline.  R eye with visible injection. No drainage at present.  EOMI. Left eye within normal limits per video view.  Assessment and Plan: 1. Acute bacterial conjunctivitis of right eye - trimethoprim-polymyxin b (POLYTRIM) ophthalmic solution; Apply 1-2 drops into affected eye QID x 5 days.  Dispense: 10 mL; Refill: 0 Mild symptoms. No alarm s/sx. Supportive measures reviewed. Will start polytrim OP as directed to resolve infection.   Follow Up Instructions: I discussed the assessment and treatment plan with the patient. The patient was provided an opportunity to ask questions and all were answered. The patient agreed with the plan and demonstrated an understanding of the instructions.  A copy of instructions were sent to the patient via MyChart.  The patient was advised to call back or seek an in-person evaluation if the symptoms worsen or if the condition fails to improve as anticipated.  Time:  I spent 12 minutes with the patient via telehealth technology discussing the above problems/concerns.    Brittany Climes, PA-C

## 2020-12-10 NOTE — Patient Instructions (Signed)
Brittany Mosley, thank you for joining [name], PA-C for today's virtual visit.  While this provider is not your primary care provider (PCP), if your PCP is located in our provider database this encounter information will be shared with them immediately following your visit.  Consent: (Patient) Brittany Mosley provided verbal consent for this virtual visit at the beginning of the encounter.  Current Medications:  Current Outpatient Medications:    fluticasone (FLONASE) 50 MCG/ACT nasal spray, Place 2 sprays into both nostrils daily. (Patient taking differently: Place 2 sprays into both nostrils as needed.), Disp: 16 g, Rfl: 11   levonorgestrel-ethinyl estradiol (SEASONALE,INTROVALE,JOLESSA) 0.15-0.03 MG tablet, Take by mouth., Disp: , Rfl:    loratadine (CLARITIN) 10 MG tablet, Take 1 tablet (10 mg total) by mouth daily. (Patient taking differently: Take 10 mg by mouth as needed.), Disp: 30 tablet, Rfl: 11   SUMAtriptan (IMITREX) 50 MG tablet, TAKE 1 TABLET BY MOUTH 1 TIME AS NEEDED FOR MIGRAINE. MAY REPEAT 1 DOSE IN 2 HOURS IF PERSISTS FOR A. MAX DOSE OF 2 TABLETS PER 24 HOURS, Disp: 12 tablet, Rfl: 5   Medications ordered in this encounter:  No orders of the defined types were placed in this encounter.    *If you need refills on other medications prior to your next appointment, please contact your pharmacy*  Follow-Up: Call back or seek an in-person evaluation if the symptoms worsen or if the condition fails to improve as anticipated.  Other Instructions Please keep hands washed. Avoid touching the face/eyes. Apply warm compress to the affected eye for 10-15 minutes, a few times per day.  Use the antibiotic drops as directed to resolve infection.  If things are not resolving, anything worsens or new symptoms develop, you need an in-person evaluation.  Bacterial Conjunctivitis, Adult Bacterial conjunctivitis is an infection of your conjunctiva. This is the clear membrane  that covers the white part of your eye and the inner part of your eyelid. This infection can make your eye: Red or pink. Itchy. This condition spreads easily from person to person (is contagious) and from one eye to the other eye. What are the causes? This condition is caused by germs (bacteria). You may get the infection if you come into close contact with: A person who has the infection. Items that have germs on them (are contaminated), such as face towels, contact lens solution, or eye makeup. What increases the risk? You are more likely to get this condition if you: Have contact with people who have the infection. Wear contact lenses. Have a sinus infection. Have had a recent eye injury or surgery. Have a weak body defense system (immune system). Have dry eyes. What are the signs or symptoms?  Thick, yellowish discharge from the eye. Tearing or watery eyes. Itchy eyes. Burning feeling in your eyes. Eye redness. Swollen eyelids. Blurred vision. How is this treated?  Antibiotic eye drops or ointment. Antibiotic medicine taken by mouth. This is used for infections that do not get better with drops or ointment or that last more than 10 days. Cool, wet cloths placed on the eyes. Artificial tears used 2-6 times a day. Follow these instructions at home: Medicines Take or apply your antibiotic medicine as told by your doctor. Do not stop taking or applying the antibiotic even if you start to feel better. Take or apply over-the-counter and prescription medicines only as told by your doctor. Do not touch your eyelid with the eye-drop bottle or the ointment tube.  Managing discomfort Wipe any fluid from your eye with a warm, wet washcloth or a cotton ball. Place a clean, cool, wet cloth on your eye. Do this for 10-20 minutes, 3-4 times per day. General instructions Do not wear contacts until the infection is gone. Wear glasses until your doctor says it is okay to wear contacts  again. Do not wear eye makeup until the infection is gone. Throw away old eye makeup. Change or wash your pillowcase every day. Do not share towels or washcloths. Wash your hands often with soap and water. Use paper towels to dry your hands. Do not touch or rub your eyes. Do not drive or use heavy machinery if your vision is blurred. Contact a doctor if: You have a fever. You do not get better after 10 days. Get help right away if: You have a fever and your symptoms get worse all of a sudden. You have very bad pain when you move your eye. Your face: Hurts. Is red. Is swollen. You have sudden loss of vision. Summary Bacterial conjunctivitis is an infection of your conjunctiva. This infection spreads easily from person to person. Wash your hands often with soap and water. Use paper towels to dry your hands. Take or apply your antibiotic medicine as told by your doctor. Contact a doctor if you have a fever or you do not get better after 10 days. This information is not intended to replace advice given to you by your health care provider. Make sure you discuss any questions you have with your healthcare provider. Document Revised: 02/26/2020 Document Reviewed: 11/09/2017 Elsevier Patient Education  2022 [employer].    If you have been instructed to have an in-person evaluation today at a local Urgent Care facility, please use the link below. It will take you to a list of all of our available [facility], including address, phone number and hours of operation. Please do not delay care.  [facility]  If you or a family member do not have a primary care provider, use the link below to schedule a visit and establish care. When you choose a [place] primary care physician or advanced practice provider, you gain a long-term partner in health. Find a Primary Care Provider  Learn more about [place]'s in-office and virtual care options: [place] - Get  Care Now

## 2021-02-20 ENCOUNTER — Ambulatory Visit
Admission: EM | Admit: 2021-02-20 | Discharge: 2021-02-20 | Disposition: A | Discharge status: Home / Self Care | Payer: BC Managed Care – PPO | Attending: Emergency Medicine | Admitting: Emergency Medicine

## 2021-02-20 ENCOUNTER — Other Ambulatory Visit: Payer: Self-pay

## 2021-02-20 ENCOUNTER — Ambulatory Visit (INDEPENDENT_AMBULATORY_CARE_PROVIDER_SITE_OTHER): Payer: BC Managed Care – PPO

## 2021-02-20 DIAGNOSIS — R0600 Dyspnea, unspecified: Secondary | ICD-10-CM | POA: Diagnosis not present

## 2021-02-20 DIAGNOSIS — J069 Acute upper respiratory infection, unspecified: Secondary | ICD-10-CM

## 2021-02-20 MED ORDER — IPRATROPIUM BROMIDE 0.06 % NA SOLN: 2.0000 | Freq: Four times a day (QID) | NASAL | 12 refills | Status: DC

## 2021-02-20 MED ORDER — AMOXICILLIN-POT CLAVULANATE 875-125 MG PO TABS: 1.0000 | ORAL_TABLET | Freq: Two times a day (BID) | ORAL | 0 refills | Status: DC

## 2021-02-20 NOTE — ED Provider Notes (Signed)
[facility]    CSN: [id number] Arrival date & time: 02/20/21  1246      History   Chief Complaint Chief Complaint  Patient presents with   Shortness of Breath    HPI Brittany Mosley is a 34 y.o. female.   HPI  35 year old female here for evaluation of shortness of breath on exertion.  Patient reports that she developed shortness of breath on exertion today.  She states she feels like she has a heaviness in the center of her chest that is not associated with cough or wheezing.  She has been experiencing runny nose and nasal congestion for the last 3 weeks and recent developed a sore throat.  She indicates that her throat is pretty severe at this point.  She is also complaining of popping in both of her ears when she swallows that started yesterday.  Patient has not had any chest pain, dizziness, syncope, or swelling in any of her extremities.  She also denies nausea, vomiting, and diarrhea.  Past Medical History:  Diagnosis Date   Anemia    Headache     Patient Active Problem List   Diagnosis Date Noted   Anemia 04/26/2016   Chronic migraine without aura, with status migrainosus 03/30/2016    Past Surgical History:  Procedure Laterality Date   CESAREAN SECTION  2010-2012    OB History   No obstetric history on file.      Home Medications    Prior to Admission medications   Medication Sig Start Date End Date Taking? Authorizing Provider  amoxicillin-clavulanate (AUGMENTIN) 875-125 MG tablet Take 1 tablet by mouth every 12 (twelve) hours. 02/20/21  Yes Becky Augusta, NP  fluticasone (FLONASE) 50 MCG/ACT nasal spray Place 2 sprays into both nostrils daily. Patient taking differently: Place 2 sprays into both nostrils as needed. 07/31/15  Yes Krebs, Amy Lauren, NP  ipratropium (ATROVENT) 0.06 % nasal spray Place 2 sprays into both nostrils 4 (four) times daily. 02/20/21  Yes Becky Augusta, NP  loratadine (CLARITIN) 10 MG tablet Take 1 tablet (10 mg total) by  mouth daily. Patient taking differently: Take 10 mg by mouth as needed. 07/31/15  Yes Krebs, Amy Lauren, NP  SUMAtriptan (IMITREX) 50 MG tablet TAKE 1 TABLET BY MOUTH 1 TIME AS NEEDED FOR MIGRAINE. MAY REPEAT 1 DOSE IN 2 HOURS IF PERSISTS FOR A. MAX DOSE OF 2 TABLETS PER 24 HOURS 09/26/19  Yes Karamalegos, Netta Neat, DO    Family History Family History  Problem Relation Age of Onset   Rheum arthritis Mother    Hearing loss Mother 20       one ear   Alzheimer's disease Maternal Grandmother    Heart disease Maternal Grandfather 5    Social History Social History   Tobacco Use   Smoking status: Never   Smokeless tobacco: Never  Substance Use Topics   Alcohol use: Yes    Comment: occ wine   Drug use: No     Allergies   Pollen extract   Review of Systems Review of Systems  Constitutional:  Positive for fatigue. Negative for activity change, appetite change and fever.  HENT:  Positive for congestion, rhinorrhea and sore throat. Negative for ear pain.   Respiratory:  Positive for chest tightness and shortness of breath. Negative for cough and wheezing.   Cardiovascular:  Negative for chest pain, palpitations and leg swelling.  Gastrointestinal:  Negative for diarrhea, nausea and vomiting.  Skin:  Negative for rash.  Hematological:  Negative.   Psychiatric/Behavioral: Negative.      Physical Exam Triage Vital Signs ED Triage Vitals  Enc Vitals Group     BP 02/20/21 1342 137/83     Pulse Rate 02/20/21 1342 (!) 106     Resp 02/20/21 1342 20     Temp 02/20/21 1342 98.6 F (37 C)     Temp Source 02/20/21 1342 Oral     SpO2 02/20/21 1342 99 %     Weight --      Height --      Head Circumference --      Peak Flow --      Pain Score 02/20/21 1343 0     Pain Loc --      Pain Edu? --      Excl. in GC? --    No data found.  Updated Vital Signs BP 137/83 (BP Location: Left Arm)   Pulse (!) 106   Temp 98.6 F (37 C) (Oral)   Resp 20   LMP 02/12/2021   SpO2 99%    Visual Acuity Right Eye Distance:   Left Eye Distance:   Bilateral Distance:    Right Eye Near:   Left Eye Near:    Bilateral Near:     Physical Exam Vitals and nursing note reviewed.  Constitutional:      General: She is not in acute distress.    Appearance: Normal appearance. She is not ill-appearing.  HENT:     Head: Normocephalic and atraumatic.     Right Ear: Tympanic membrane, ear canal and external ear normal. There is no impacted cerumen.     Left Ear: Tympanic membrane, ear canal and external ear normal. There is no impacted cerumen.     Nose: Congestion and rhinorrhea present.     Mouth/Throat:     Mouth: Mucous membranes are moist.     Pharynx: Oropharynx is clear. Posterior oropharyngeal erythema present.  Cardiovascular:     Rate and Rhythm: Normal rate and regular rhythm.     Pulses: Normal pulses.     Heart sounds: Normal heart sounds. No murmur heard.   No gallop.  Pulmonary:     Effort: Pulmonary effort is normal.     Breath sounds: Normal breath sounds. No wheezing, rhonchi or rales.  Musculoskeletal:     Cervical back: Normal range of motion and neck supple. No tenderness.  Lymphadenopathy:     Cervical: No cervical adenopathy.  Skin:    General: Skin is warm and dry.     Capillary Refill: Capillary refill takes less than 2 seconds.     Findings: No erythema or rash.  Neurological:     General: No focal deficit present.     Mental Status: She is alert and oriented to person, place, and time.  Psychiatric:        Mood and Affect: Mood normal.        Behavior: Behavior normal.        Thought Content: Thought content normal.        Judgment: Judgment normal.     UC Treatments / Results  Labs (all labs ordered are listed, but only abnormal results are displayed) Labs Reviewed - No data to display  EKG Normal sinus rhythm with a ventricular rate of 95 bpm PR interval 170 ms QRS duration 78 ms QT/QTc 366/459 ms No ST or T wave abnormality  noted. Computer interpretation is normal sinus rhythm, possible left atrial enlargement, borderline EKG.  Radiology DG Chest 2 View  Result Date: 02/20/2021 CLINICAL DATA:  Dyspnea on exertion EXAM: CHEST - 2 VIEW COMPARISON:  03/02/2018 FINDINGS: The heart size and mediastinal contours are within normal limits. Both lungs are clear. The visualized skeletal structures are unremarkable. IMPRESSION: No acute abnormality of the lungs. Electronically Signed   By: Jearld Lesch M.D.   On: 02/20/2021 14:22    Procedures Procedures (including critical care time)  Medications Ordered in UC Medications - No data to display  Initial Impression / Assessment and Plan / UC Course  I have reviewed the triage vital signs and the nursing notes.  Pertinent labs & imaging results that were available during my care of the patient were reviewed by me and considered in my medical decision making (see chart for details).  Patient is a very pleasant, nontoxic-appearing 35 year old female here for evaluation of shortness of breath on exertion that started yesterday.  She has had some associated upper respiratory symptoms of been going on for last 3 weeks but states she feels like it settled in her chest.  She states she felt something similar when she had pneumonia years ago but at that time she had a cough and she does not have a cough with this episode.  She also denies any wheezing or swelling in her hands, feet, ankles, or lower legs.  She denies any chest pain but does describes it as a heaviness.  Patient's physical exam reveals mildly ceruminous external auditory canals bilaterally but I am able to visualize the tympanic membrane which is pearly gray in appearance.  He is mucosas erythematous edematous with clear nasal discharge.  Oropharyngeal exam reveals posterior oropharyngeal erythema and injection without any exudate or lesions.  No cervical lymphadenopathy appreciated on exam.  Cardiopulmonary exam  reveals S1-S2 heart sounds without murmur, rub, or ectopy.  Lung sounds are clear to auscultation all fields.  Etiology of patient's shortness of breath exertion is unclear.  Will obtain chest x-ray and EKG.  Chest x-ray independently reviewed and evaluated by me.  Impression: Lungs are well pneumatized.  No effusion or infiltrate noted.  Radiology overread is pending. Radiology impression is no acute abnormality of the lungs.  EKG is unremarkable.  Compared with the description of twelve-lead EKG performed at [facility] 11/30/2017 there is no change as it was reported as normal sinus rhythm with sinus arrhythmia, possible left atrial enlargement, borderline EKG.   Final Clinical Impressions(s) / UC Diagnoses   Final diagnoses:  Acute upper respiratory infection     Discharge Instructions      Your EKG was very reassuring and unchanged when compared to EKG from 2019.  Your chest x-ray did not show any evidence of pneumonia.  Your symptoms are consistent with an upper respiratory infection and due to the protracted duration of symptoms I think it is worthwhile doing a trial of antibiotics.  Take the Augmentin twice daily with food for 7 days for treatment of your upper respiratory infection.  Use the Atrovent nasal spray, 2 squirts up each nostril 4 times a day as needed for runny nose and nasal congestion.  Gargle with warm salt water 2-3 times a day to wash away the postnasal drip which may be making your throat more sore.  You may also use over-the-counter Chloraseptic or Sucrets lozenges, 1 lozenge every 2 hours as needed for throat pain.  Take over-the-counter Tylenol and ibuprofen according to package instructions as needed for pain as well.  Return for reevaluation, or see  your primary care provider, for new or worsening symptoms.     ED Prescriptions     Medication Sig Dispense Auth. Provider   amoxicillin-clavulanate (AUGMENTIN) 875-125 MG tablet Take 1 tablet by mouth  every 12 (twelve) hours. 14 tablet Becky Augusta, NP   ipratropium (ATROVENT) 0.06 % nasal spray Place 2 sprays into both nostrils 4 (four) times daily. 15 mL Becky Augusta, NP      PDMP not reviewed this encounter.   Becky Augusta, NP 02/20/21 1452

## 2021-02-20 NOTE — ED Triage Notes (Signed)
Pt presents with SOB with activity starting in the last day.  States she has had cold symptoms/congestion for a few weeks and felt like it moved into her chest yesterday.  Reminds her of the pnuemonia she had a few years ago.  Mild, infrequent cough and sore throat.

## 2021-02-20 NOTE — Discharge Instructions (Addendum)
Your EKG was very reassuring and unchanged when compared to EKG from 2019.  Your chest x-ray did not show any evidence of pneumonia.  Your symptoms are consistent with an upper respiratory infection and due to the protracted duration of symptoms I think it is worthwhile doing a trial of antibiotics.  Take the Augmentin twice daily with food for 7 days for treatment of your upper respiratory infection.  Use the Atrovent nasal spray, 2 squirts up each nostril 4 times a day as needed for runny nose and nasal congestion.  Gargle with warm salt water 2-3 times a day to wash away the postnasal drip which may be making your throat more sore.  You may also use over-the-counter Chloraseptic or Sucrets lozenges, 1 lozenge every 2 hours as needed for throat pain.  Take over-the-counter Tylenol and ibuprofen according to package instructions as needed for pain as well.  Return for reevaluation, or see your primary care provider, for new or worsening symptoms.

## 2021-07-14 ENCOUNTER — Ambulatory Visit: Payer: BC Managed Care – PPO | Admitting: Physician Assistant

## 2021-07-14 ENCOUNTER — Other Ambulatory Visit: Payer: Self-pay

## 2021-07-14 ENCOUNTER — Encounter: Payer: Self-pay | Admitting: Physician Assistant

## 2021-07-14 VITALS — BP 108/70 | HR 101 | Temp 97.7°F | Wt 169.0 lb

## 2021-07-14 DIAGNOSIS — G43701 Chronic migraine without aura, not intractable, with status migrainosus: Secondary | ICD-10-CM | POA: Diagnosis not present

## 2021-07-14 DIAGNOSIS — J029 Acute pharyngitis, unspecified: Secondary | ICD-10-CM | POA: Diagnosis not present

## 2021-07-14 DIAGNOSIS — J020 Streptococcal pharyngitis: Secondary | ICD-10-CM | POA: Diagnosis not present

## 2021-07-14 MED ORDER — SUMATRIPTAN SUCCINATE 50 MG PO TABS: ORAL_TABLET | ORAL | 5 refills | Status: AC

## 2021-07-14 NOTE — Patient Instructions (Signed)
You can continue to alternate Ibuprofen and Tylenol as needed for fever management and pain ?I recommend using warm beverages (hot tea and honey) or cool beverages/ foods to help soothe throat pain ?We will send a swab for culture to further rule out Strep A and will keep you updated with those results ?If needed I will send in an antibiotic for you to take to help resolve this.  ? ?It was nice to meet you and I appreciate the opportunity to be involved in your care ? ?

## 2021-07-14 NOTE — Progress Notes (Signed)
? ? ?  ?    Acute Office Visit ? ? ?Patient: Brittany Mosley   DOB: 1985-08-02   36 y.o. Female  MRN: [id number] ?Visit Date: 07/14/2021 ? ?Today's healthcare provider: Oswaldo Conroy Blaize Nipper, PA-C  ?Introduced myself to the patient as a Secondary school teacher and provided education on APPs in clinical practice.  ? ? ?Chief Complaint  ?Patient presents with  ? Sore Throat  ? ?Subjective  ?  ?HPI  ? ? 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 ?Medications: ?Outpatient Medications Prior to Visit  ?Medication Sig  ? fluticasone (FLONASE) 50 MCG/ACT nasal spray Place 2 sprays into both nostrils daily. (Patient taking differently: Place 2 sprays into both nostrils as needed.)  ? loratadine (CLARITIN) 10 MG tablet Take 1 tablet (10 mg total) by mouth daily. (Patient taking differently: Take 10 mg by mouth as needed.)  ? [DISCONTINUED] ipratropium (ATROVENT) 0.06 % nasal spray Place 2 sprays into both nostrils 4 (four) times daily.  ? [DISCONTINUED] SUMAtriptan (IMITREX) 50 MG tablet TAKE 1 TABLET BY MOUTH 1 TIME AS NEEDED FOR MIGRAINE. MAY REPEAT 1 DOSE IN 2 HOURS IF PERSISTS FOR A. MAX DOSE OF 2 TABLETS PER 24 HOURS  ? [DISCONTINUED] amoxicillin-clavulanate (AUGMENTIN) 875-125 MG tablet Take 1 tablet by mouth every 12 (twelve) hours. (Patient not taking: Reported on 07/14/2021)  ? ?No facility-administered medications prior to visit.  ? ? ?Review of Systems  ?Constitutional:  Positive for fatigue and fever.  ?HENT:  Positive for sore throat. Negative for congestion, ear pain, rhinorrhea and trouble swallowing.   ?Respiratory:  Negative for cough and  shortness of breath.   ?Cardiovascular:  Negative for chest pain and palpitations.  ?Gastrointestinal:  Negative for diarrhea, nausea and vomiting.  ?Musculoskeletal:  Negative for neck pain and neck stiffness.  ?Skin:  Negative for rash.  ?Neurological:  Negative for dizziness, light-headedness and headaches.  ?Hematological:  Positive for adenopathy.  ? ? ?  Objective  ?  ?BP 108/70 (BP Location: Left Arm, Patient Position: Sitting, Cuff Size: Large)   Pulse (!) 101   Temp 97.7 ?F (36.5 ?C) (Temporal)   Wt 169 lb (76.7 kg)   SpO2 100%   BMI 30.91 kg/m?  ? ? ?Physical Exam ?Vitals reviewed.  ?Constitutional:   ?   Appearance: She is well-developed.  ?HENT:  ?   Head: Normocephalic and atraumatic.  ?   Mouth/Throat:  ?   Mouth: Mucous membranes are moist.  ?   Pharynx: Oropharynx is clear. Uvula midline. Posterior oropharyngeal erythema present. No pharyngeal swelling, oropharyngeal exudate or uvula swelling.  ?Neck:  ?   Trachea: Trachea and phonation normal.  ?Cardiovascular:  ?   Rate and Rhythm: Normal rate and regular rhythm.  ?   Pulses: Normal pulses.  ?   Heart sounds: Normal heart sounds.  ?Pulmonary:  ?   Effort: Pulmonary effort is normal.  ?   Breath sounds: Normal breath sounds and air entry. No decreased breath sounds, wheezing, rhonchi or rales.  ?Musculoskeletal:  ?   Cervical back: Normal range of motion and  neck supple. Normal range of motion.  ?   Right lower leg: No edema.  ?   Left lower leg: No edema.  ?Lymphadenopathy:  ?   Head:  ?   Right side of head: No submental, submandibular or preauricular adenopathy.  ?   Left side of head: No submental, submandibular or preauricular adenopathy.  ?   Cervical: No cervical adenopathy.  ?   Upper Body:  ?   Right upper body: No supraclavicular adenopathy.  ?   Left upper body: No supraclavicular adenopathy.  ?Neurological:  ?   Mental Status: She is alert.  ?Psychiatric:     ?   Mood and Affect: Mood normal.     ?   Behavior: Behavior normal.  ?   ? ? ?No results found for any visits on 07/14/21. ? Assessment & Plan  ?  ? ?Problem List Items Addressed This Visit   ? ?  ? Cardiovascular and Mediastinum  ? Chronic migraine without aura, with status migrainosus  ? Relevant Medications  ? SUMAtriptan (IMITREX) 50 MG tablet  ? ?Other Visit Diagnoses   ? ? 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  ? ?  ? ? ? ?No follow-ups on file. ? ? ?I, Davion Flannery E Truc Winfree, PA-C, have reviewed all documentation for this visit. The documentation on 07/14/21 for the exam, diagnosis, procedures, and orders are all accurate and complete. ? ? ?Sony Schlarb, Mirian Mo MPH ?[facility] ?[facility] ? ? ? ? ?No follow-ups on file.  ?   ? ? ? ? ?

## 2021-07-14 NOTE — Assessment & Plan Note (Signed)
Chronic, historic condition  ?States she is able to manage very well and uses Sumatriptan very sparingly but thinks she is about to run out completely ?Refill provided today ?

## 2021-07-20 ENCOUNTER — Encounter: Payer: Self-pay | Admitting: Physician Assistant

## 2021-07-20 DIAGNOSIS — J029 Acute pharyngitis, unspecified: Secondary | ICD-10-CM

## 2021-07-21 ENCOUNTER — Encounter: Payer: Self-pay | Admitting: Family Medicine

## 2021-07-21 ENCOUNTER — Ambulatory Visit (INDEPENDENT_AMBULATORY_CARE_PROVIDER_SITE_OTHER): Payer: Self-pay

## 2021-07-21 VITALS — Wt 169.0 lb

## 2021-07-21 DIAGNOSIS — J029 Acute pharyngitis, unspecified: Secondary | ICD-10-CM

## 2021-07-21 NOTE — Addendum Note (Signed)
Addended by: Burnell Blanks on: 07/21/2021 04:29 PM ? ? Modules accepted: Orders ? ?

## 2021-07-23 ENCOUNTER — Encounter: Payer: Self-pay | Admitting: Physician Assistant

## 2021-07-23 DIAGNOSIS — B379 Candidiasis, unspecified: Secondary | ICD-10-CM

## 2021-07-23 LAB — CULTURE, GROUP A STREP
MICRO NUMBER:: 13225636
SPECIMEN QUALITY:: ADEQUATE

## 2021-07-23 MED ORDER — FLUCONAZOLE 150 MG PO TABS: 150.0000 mg | ORAL_TABLET | Freq: Once | ORAL | 0 refills | Status: AC

## 2021-07-23 MED ORDER — AMOXICILLIN 500 MG PO TABS: 500.0000 mg | ORAL_TABLET | Freq: Two times a day (BID) | ORAL | 0 refills | Status: AC

## 2021-07-23 NOTE — Addendum Note (Signed)
Addended by: Jacquelin Hawking on: 07/23/2021 01:17 PM ? ? Modules accepted: Orders ? ?

## 2021-07-24 NOTE — Progress Notes (Deleted)
Done

## 2021-07-28 NOTE — Progress Notes (Signed)
Appt was canceled

## 2021-07-28 NOTE — Progress Notes (Deleted)
Strep swab
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
## 2022-01-27 ENCOUNTER — Telehealth: Payer: BC Managed Care – PPO | Admitting: Physician Assistant

## 2022-01-27 DIAGNOSIS — B9689 Other specified bacterial agents as the cause of diseases classified elsewhere: Secondary | ICD-10-CM

## 2022-01-27 DIAGNOSIS — J208 Acute bronchitis due to other specified organisms: Secondary | ICD-10-CM | POA: Diagnosis not present

## 2022-01-27 MED ORDER — BENZONATATE 100 MG PO CAPS: 100.0000 mg | ORAL_CAPSULE | Freq: Three times a day (TID) | ORAL | 0 refills | Status: AC | PRN

## 2022-01-27 MED ORDER — PREDNISONE 10 MG (21) PO TBPK: ORAL_TABLET | ORAL | 0 refills | Status: AC

## 2022-01-27 MED ORDER — AZITHROMYCIN 250 MG PO TABS: ORAL_TABLET | ORAL | 0 refills | Status: AC

## 2022-01-27 NOTE — Progress Notes (Signed)
We are sorry that you are not feeling well.  Here is how we plan to help!  Based on your presentation I believe you most likely have A cough due to bacteria.  When patients have a fever and a productive cough with a change in color or increased sputum production, we are concerned about bacterial bronchitis.  If left untreated it can progress to pneumonia.  If your symptoms do not improve with your treatment plan it is important that you contact your provider.   I have prescribed Azithromyin 250 mg: two tablets now and then one tablet daily for 4 additonal days    In addition you may use A non-prescription cough medication called Mucinex DM: take 2 tablets every 12 hours. and A prescription cough medication called Tessalon Perles 100mg. You may take 1-2 capsules every 8 hours as needed for your cough.  Prednisone 10 mg daily for 6 days (see taper instructions below)  Directions for 6 day taper: Day 1: 2 tablets before breakfast, 1 after both lunch & dinner and 2 at bedtime Day 2: 1 tab before breakfast, 1 after both lunch & dinner and 2 at bedtime Day 3: 1 tab at each meal & 1 at bedtime Day 4: 1 tab at breakfast, 1 at lunch, 1 at bedtime Day 5: 1 tab at breakfast & 1 tab at bedtime Day 6: 1 tab at breakfast  From your responses in the eVisit questionnaire you describe inflammation in the upper respiratory tract which is causing a significant cough.  This is commonly called Bronchitis and has four common causes:   Allergies Viral Infections Acid Reflux Bacterial Infection Allergies, viruses and acid reflux are treated by controlling symptoms or eliminating the cause. An example might be a cough caused by taking certain blood pressure medications. You stop the cough by changing the medication. Another example might be a cough caused by acid reflux. Controlling the reflux helps control the cough.  USE OF BRONCHODILATOR ("RESCUE") INHALERS: There is a risk from using your bronchodilator too  frequently.  The risk is that over-reliance on a medication which only relaxes the muscles surrounding the breathing tubes can reduce the effectiveness of medications prescribed to reduce swelling and congestion of the tubes themselves.  Although you feel brief relief from the bronchodilator inhaler, your asthma may actually be worsening with the tubes becoming more swollen and filled with mucus.  This can delay other crucial treatments, such as oral steroid medications. If you need to use a bronchodilator inhaler daily, several times per day, you should discuss this with your provider.  There are probably better treatments that could be used to keep your asthma under control.     HOME CARE Only take medications as instructed by your medical team. Complete the entire course of an antibiotic. Drink plenty of fluids and get plenty of rest. Avoid close contacts especially the very young and the elderly Cover your mouth if you cough or cough into your sleeve. Always remember to wash your hands A steam or ultrasonic humidifier can help congestion.   GET HELP RIGHT AWAY IF: You develop worsening fever. You become short of breath You cough up blood. Your symptoms persist after you have completed your treatment plan MAKE SURE YOU  Understand these instructions. Will watch your condition. Will get help right away if you are not doing well or get worse.    Thank you for choosing an e-visit.  Your e-visit answers were reviewed by a board certified advanced clinical   practitioner to complete your personal care plan. Depending upon the condition, your plan could have included both over the counter or prescription medications.  Please review your pharmacy choice. Make sure the pharmacy is open so you can pick up prescription now. If there is a problem, you may contact your provider through MyChart messaging and have the prescription routed to another pharmacy.  Your safety is important to us. If you have  drug allergies check your prescription carefully.   For the next 24 hours you can use MyChart to ask questions about today's visit, request a non-urgent call back, or ask for a work or school excuse. You will get an email in the next two days asking about your experience. I hope that your e-visit has been valuable and will speed your recovery.  I have spent 5 minutes in review of e-visit questionnaire, review and updating patient chart, medical decision making and response to patient.   Lissett Favorite M Kristjan Derner, PA-C  

## 2022-05-18 ENCOUNTER — Telehealth: Payer: BC Managed Care – PPO | Admitting: Nurse Practitioner

## 2022-05-18 DIAGNOSIS — J069 Acute upper respiratory infection, unspecified: Secondary | ICD-10-CM | POA: Diagnosis not present

## 2022-05-18 MED ORDER — FLUTICASONE PROPIONATE 50 MCG/ACT NA SUSP: 2.0000 | Freq: Every day | NASAL | 6 refills | Status: AC

## 2022-05-18 NOTE — Progress Notes (Signed)
E-Visit for Upper Respiratory Infection   We are sorry you are not feeling well.  Here is how we plan to help!  Based on what you have shared with me, it looks like you may have a viral upper respiratory infection.  Upper respiratory infections are caused by a large number of viruses; however, rhinovirus is the most common cause.   Ear pressure may be present for several days without the formation of an ear infection. We typically recommend a 48 hour watch and wait in adults with ear pressure associated with other viral symptoms.   Due to the congestion and sore throat you may want to consider a home COVID test as these symptoms are consistent with COVID.   Symptoms vary from person to person, with common symptoms including sore throat, cough, fatigue or lack of energy and feeling of general discomfort.  A low-grade fever of up to 100.4 may present, but is often uncommon.  Symptoms vary however, and are closely related to a person's age or underlying illnesses.  The most common symptoms associated with an upper respiratory infection are nasal discharge or congestion, cough, sneezing, headache and pressure in the ears and face.  These symptoms usually persist for about 3 to 10 days, but can last up to 2 weeks.  It is important to know that upper respiratory infections do not cause serious illness or complications in most cases.    Upper respiratory infections can be transmitted from person to person, with the most common method of transmission being a person's hands.  The virus is able to live on the skin and can infect other persons for up to 2 hours after direct contact.  Also, these can be transmitted when someone coughs or sneezes; thus, it is important to cover the mouth to reduce this risk.  To keep the spread of the illness at [place], good hand hygiene is very important.  This is an infection that is most likely caused by a virus. There are no specific treatments other than to help you with the  symptoms until the infection runs its course.  We are sorry you are not feeling well.  Here is how we plan to help!   For nasal congestion, you may use an oral decongestants such as Mucinex D or if you have glaucoma or high blood pressure use plain Mucinex.  Saline nasal spray or nasal drops can help and can safely be used as often as needed for congestion.  For your congestion, I have prescribed Fluticasone nasal spray one spray in each nostril twice a day. This will help pull the fluid away from your ears and hopefully relieve the pressure you are feeling. Ibuprofen is also helpful along with warm compresses to your ear.   If you do not have a history of heart disease, hypertension, diabetes or thyroid disease, prostate/bladder issues or glaucoma, you may also use Sudafed to treat nasal congestion.  It is highly recommended that you consult with a pharmacist or your primary care physician to ensure this medication is safe for you to take.     If you have a cough, you may use cough suppressants such as Delsym and Robitussin.  If you have glaucoma or high blood pressure, you can also use Coricidin HBP.     If you have a sore or scratchy throat, use a saltwater gargle-  to  teaspoon of salt dissolved in a 4-ounce to 8-ounce glass of warm water.  Gargle the solution for approximately 15-30  seconds and then spit.  It is important not to swallow the solution.  You can also use throat lozenges/cough drops and Chloraseptic spray to help with throat pain or discomfort.  Warm or cold liquids can also be helpful in relieving throat pain.  For headache, pain or general discomfort, you can use Ibuprofen or Tylenol as directed.   Some authorities believe that zinc sprays or the use of Echinacea may shorten the course of your symptoms.   HOME CARE Only take medications as instructed by your medical team. Be sure to drink plenty of fluids. Water is fine as well as fruit juices, sodas and electrolyte beverages.  You may want to stay away from caffeine or alcohol. If you are nauseated, try taking small sips of liquids. How do you know if you are getting enough fluid? Your urine should be a pale yellow or almost colorless. Get rest. Taking a steamy shower or using a humidifier may help nasal congestion and ease sore throat pain. You can place a towel over your head and breathe in the steam from hot water coming from a faucet. Using a saline nasal spray works much the same way. Cough drops, hard candies and sore throat lozenges may ease your cough. Avoid close contacts especially the very young and the elderly Cover your mouth if you cough or sneeze Always remember to wash your hands.   GET HELP RIGHT AWAY IF: You develop worsening fever. If your symptoms do not improve within 10 days You develop yellow or green discharge from your nose over 3 days. You have coughing fits You develop a severe head ache or visual changes. You develop shortness of breath, difficulty breathing or start having chest pain Your symptoms persist after you have completed your treatment plan  MAKE SURE YOU  Understand these instructions. Will watch your condition. Will get help right away if you are not doing well or get worse.  Thank you for choosing an e-visit.  Your e-visit answers were reviewed by a board certified advanced clinical practitioner to complete your personal care plan. Depending upon the condition, your plan could have included both over the counter or prescription medications.  Please review your pharmacy choice. Make sure the pharmacy is open so you can pick up prescription now. If there is a problem, you may contact your provider through [employer] and have the prescription routed to another pharmacy.  Your safety is important to [place]. If you have drug allergies check your prescription carefully.   For the next 24 hours you can use MyChart to ask questions about today's visit, request a non-urgent  call back, or ask for a work or school excuse. You will get an email in the next two days asking about your experience. I hope that your e-visit has been valuable and will speed your recovery.  Meds ordered this encounter  Medications   fluticasone (FLONASE) 50 MCG/ACT nasal spray    Sig: Place 2 sprays into both nostrils daily.    Dispense:  16 g    Refill:  6     I spent approximately 5 minutes reviewing the patient's history, current symptoms and coordinating their care today.

## 2023-08-24 DIAGNOSIS — D485 Neoplasm of uncertain behavior of skin: Secondary | ICD-10-CM | POA: Diagnosis not present

## 2023-10-03 DIAGNOSIS — C4491 Basal cell carcinoma of skin, unspecified: Secondary | ICD-10-CM | POA: Diagnosis not present

## 2023-10-03 DIAGNOSIS — C44519 Basal cell carcinoma of skin of other part of trunk: Secondary | ICD-10-CM | POA: Diagnosis not present

## 2024-02-03 DIAGNOSIS — M25512 Pain in left shoulder: Secondary | ICD-10-CM | POA: Diagnosis not present

## 2024-02-03 DIAGNOSIS — M5412 Radiculopathy, cervical region: Secondary | ICD-10-CM | POA: Diagnosis not present

## 2024-03-05 DIAGNOSIS — D2372 Other benign neoplasm of skin of left lower limb, including hip: Secondary | ICD-10-CM | POA: Diagnosis not present

## 2024-03-05 DIAGNOSIS — D2371 Other benign neoplasm of skin of right lower limb, including hip: Secondary | ICD-10-CM | POA: Diagnosis not present

## 2024-03-05 DIAGNOSIS — L578 Other skin changes due to chronic exposure to nonionizing radiation: Secondary | ICD-10-CM | POA: Diagnosis not present

## 2024-03-05 DIAGNOSIS — D239 Other benign neoplasm of skin, unspecified: Secondary | ICD-10-CM | POA: Diagnosis not present

## 2024-03-05 DIAGNOSIS — Z85828 Personal history of other malignant neoplasm of skin: Secondary | ICD-10-CM | POA: Diagnosis not present

## 2024-03-05 DIAGNOSIS — L821 Other seborrheic keratosis: Secondary | ICD-10-CM | POA: Diagnosis not present

## 2024-03-05 DIAGNOSIS — D2272 Melanocytic nevi of left lower limb, including hip: Secondary | ICD-10-CM | POA: Diagnosis not present

## 2024-03-05 DIAGNOSIS — D485 Neoplasm of uncertain behavior of skin: Secondary | ICD-10-CM | POA: Diagnosis not present
